# Patient Record
Sex: Male | Born: 1935 | Hispanic: No | Marital: Married | Smoking: Former smoker
Health system: Southern US, Community
[De-identification: ages and names within clinical notes are randomized; demographics above are authoritative.]

## PROBLEM LIST (undated history)

## (undated) DIAGNOSIS — E559 Vitamin D deficiency, unspecified: Secondary | ICD-10-CM

## (undated) DIAGNOSIS — F329 Major depressive disorder, single episode, unspecified: Secondary | ICD-10-CM

## (undated) DIAGNOSIS — R062 Wheezing: Secondary | ICD-10-CM

## (undated) DIAGNOSIS — I5022 Chronic systolic (congestive) heart failure: Secondary | ICD-10-CM

## (undated) DIAGNOSIS — R531 Weakness: Secondary | ICD-10-CM

## (undated) DIAGNOSIS — I4891 Unspecified atrial fibrillation: Secondary | ICD-10-CM

## (undated) DIAGNOSIS — D509 Iron deficiency anemia, unspecified: Secondary | ICD-10-CM

## (undated) DIAGNOSIS — F32A Depression, unspecified: Secondary | ICD-10-CM

## (undated) DIAGNOSIS — K219 Gastro-esophageal reflux disease without esophagitis: Secondary | ICD-10-CM

## (undated) DIAGNOSIS — N400 Enlarged prostate without lower urinary tract symptoms: Secondary | ICD-10-CM

## (undated) DIAGNOSIS — J189 Pneumonia, unspecified organism: Secondary | ICD-10-CM

## (undated) DIAGNOSIS — R269 Unspecified abnormalities of gait and mobility: Secondary | ICD-10-CM

## (undated) DIAGNOSIS — E039 Hypothyroidism, unspecified: Secondary | ICD-10-CM

## (undated) DIAGNOSIS — G309 Alzheimer's disease, unspecified: Secondary | ICD-10-CM

## (undated) DIAGNOSIS — I456 Pre-excitation syndrome: Secondary | ICD-10-CM

## (undated) DIAGNOSIS — I2510 Atherosclerotic heart disease of native coronary artery without angina pectoris: Secondary | ICD-10-CM

## (undated) DIAGNOSIS — E119 Type 2 diabetes mellitus without complications: Secondary | ICD-10-CM

## (undated) DIAGNOSIS — F0390 Unspecified dementia without behavioral disturbance: Secondary | ICD-10-CM

## (undated) DIAGNOSIS — F0280 Dementia in other diseases classified elsewhere without behavioral disturbance: Secondary | ICD-10-CM

## (undated) DIAGNOSIS — I10 Essential (primary) hypertension: Secondary | ICD-10-CM

## (undated) DIAGNOSIS — I739 Peripheral vascular disease, unspecified: Secondary | ICD-10-CM

## (undated) DIAGNOSIS — N189 Chronic kidney disease, unspecified: Secondary | ICD-10-CM

## (undated) DIAGNOSIS — E785 Hyperlipidemia, unspecified: Secondary | ICD-10-CM

## (undated) HISTORY — PX: IMPLANTABLE CARDIOVERTER DEFIBRILLATOR IMPLANT: SHX5860

## (undated) HISTORY — PX: PACEMAKER PLACEMENT: SHX43

## (undated) HISTORY — DX: Benign prostatic hyperplasia without lower urinary tract symptoms: N40.0

## (undated) HISTORY — DX: Iron deficiency anemia, unspecified: D50.9

## (undated) HISTORY — PX: CORONARY ARTERY BYPASS GRAFT: SHX141

## (undated) HISTORY — DX: Wheezing: R06.2

## (undated) HISTORY — DX: Vitamin D deficiency, unspecified: E55.9

## (undated) HISTORY — DX: Weakness: R53.1

## (undated) HISTORY — DX: Unspecified abnormalities of gait and mobility: R26.9

## (undated) HISTORY — PX: ABDOMINAL AORTIC ANEURYSM REPAIR: SUR1152

## (undated) HISTORY — DX: Chronic kidney disease, unspecified: N18.9

## (undated) HISTORY — DX: Pneumonia, unspecified organism: J18.9

## (undated) HISTORY — DX: Gastro-esophageal reflux disease without esophagitis: K21.9

---

## 2003-08-02 ENCOUNTER — Other Ambulatory Visit: Payer: Self-pay

## 2005-11-28 ENCOUNTER — Ambulatory Visit: Payer: Self-pay | Admitting: Internal Medicine

## 2005-12-16 ENCOUNTER — Ambulatory Visit: Payer: Self-pay | Admitting: Internal Medicine

## 2006-01-28 ENCOUNTER — Ambulatory Visit: Payer: Self-pay | Admitting: Internal Medicine

## 2006-02-11 ENCOUNTER — Ambulatory Visit: Payer: Self-pay | Admitting: Internal Medicine

## 2006-02-16 ENCOUNTER — Ambulatory Visit: Payer: Self-pay | Admitting: Internal Medicine

## 2006-03-18 ENCOUNTER — Ambulatory Visit: Payer: Self-pay | Admitting: Internal Medicine

## 2006-04-18 ENCOUNTER — Ambulatory Visit: Payer: Self-pay | Admitting: Internal Medicine

## 2006-08-11 ENCOUNTER — Emergency Department: Payer: Self-pay | Admitting: Internal Medicine

## 2007-05-06 ENCOUNTER — Inpatient Hospital Stay: Payer: Self-pay | Admitting: Internal Medicine

## 2007-05-06 ENCOUNTER — Other Ambulatory Visit: Payer: Self-pay

## 2007-05-19 ENCOUNTER — Ambulatory Visit: Payer: Self-pay | Admitting: Internal Medicine

## 2007-05-22 ENCOUNTER — Ambulatory Visit: Payer: Self-pay | Admitting: Family

## 2007-06-19 ENCOUNTER — Ambulatory Visit: Payer: Self-pay | Admitting: Internal Medicine

## 2007-07-19 ENCOUNTER — Ambulatory Visit: Payer: Self-pay | Admitting: Internal Medicine

## 2007-08-15 ENCOUNTER — Observation Stay: Payer: Self-pay | Admitting: Internal Medicine

## 2007-08-17 ENCOUNTER — Ambulatory Visit: Payer: Self-pay | Admitting: Internal Medicine

## 2007-08-21 ENCOUNTER — Ambulatory Visit: Payer: Self-pay | Admitting: Internal Medicine

## 2007-09-11 ENCOUNTER — Ambulatory Visit: Payer: Self-pay | Admitting: Unknown Physician Specialty

## 2007-09-14 ENCOUNTER — Inpatient Hospital Stay: Payer: Self-pay | Admitting: Internal Medicine

## 2007-09-14 ENCOUNTER — Other Ambulatory Visit: Payer: Self-pay

## 2007-09-17 ENCOUNTER — Ambulatory Visit: Payer: Self-pay | Admitting: Internal Medicine

## 2007-09-21 ENCOUNTER — Ambulatory Visit: Payer: Self-pay | Admitting: Internal Medicine

## 2007-10-09 ENCOUNTER — Other Ambulatory Visit: Payer: Self-pay

## 2007-10-09 ENCOUNTER — Inpatient Hospital Stay: Payer: Self-pay | Admitting: Internal Medicine

## 2007-10-17 ENCOUNTER — Ambulatory Visit: Payer: Self-pay | Admitting: Internal Medicine

## 2007-11-17 ENCOUNTER — Ambulatory Visit: Payer: Self-pay | Admitting: Internal Medicine

## 2007-12-17 ENCOUNTER — Ambulatory Visit: Payer: Self-pay | Admitting: Internal Medicine

## 2008-01-14 ENCOUNTER — Emergency Department: Payer: Self-pay | Admitting: Unknown Physician Specialty

## 2008-01-29 ENCOUNTER — Emergency Department: Payer: Self-pay | Admitting: Emergency Medicine

## 2008-01-30 ENCOUNTER — Other Ambulatory Visit: Payer: Self-pay

## 2008-01-30 ENCOUNTER — Emergency Department: Payer: Self-pay | Admitting: Emergency Medicine

## 2008-02-17 ENCOUNTER — Ambulatory Visit: Payer: Self-pay | Admitting: Internal Medicine

## 2008-03-12 ENCOUNTER — Ambulatory Visit: Payer: Self-pay | Admitting: Internal Medicine

## 2008-03-17 ENCOUNTER — Inpatient Hospital Stay: Payer: Self-pay | Admitting: Internal Medicine

## 2008-03-19 ENCOUNTER — Ambulatory Visit: Payer: Self-pay | Admitting: Internal Medicine

## 2008-04-18 ENCOUNTER — Ambulatory Visit: Payer: Self-pay | Admitting: Internal Medicine

## 2008-05-18 ENCOUNTER — Ambulatory Visit: Payer: Self-pay | Admitting: Internal Medicine

## 2008-06-18 ENCOUNTER — Ambulatory Visit: Payer: Self-pay | Admitting: Internal Medicine

## 2008-07-19 ENCOUNTER — Ambulatory Visit: Payer: Self-pay | Admitting: Internal Medicine

## 2008-07-26 ENCOUNTER — Emergency Department: Payer: Self-pay | Admitting: Emergency Medicine

## 2008-08-05 ENCOUNTER — Inpatient Hospital Stay: Payer: Self-pay | Admitting: Internal Medicine

## 2008-08-16 ENCOUNTER — Ambulatory Visit: Payer: Self-pay | Admitting: Internal Medicine

## 2008-08-17 ENCOUNTER — Inpatient Hospital Stay: Payer: Self-pay | Admitting: Internal Medicine

## 2008-08-25 ENCOUNTER — Ambulatory Visit: Payer: Self-pay | Admitting: Internal Medicine

## 2009-04-18 ENCOUNTER — Ambulatory Visit: Payer: Self-pay | Admitting: Internal Medicine

## 2009-05-16 ENCOUNTER — Ambulatory Visit: Payer: Self-pay | Admitting: Internal Medicine

## 2009-05-18 ENCOUNTER — Ambulatory Visit: Payer: Self-pay | Admitting: Internal Medicine

## 2009-06-18 ENCOUNTER — Ambulatory Visit: Payer: Self-pay | Admitting: Internal Medicine

## 2009-07-19 ENCOUNTER — Ambulatory Visit: Payer: Self-pay | Admitting: Internal Medicine

## 2009-07-27 ENCOUNTER — Ambulatory Visit: Payer: Self-pay | Admitting: Gastroenterology

## 2009-08-16 ENCOUNTER — Ambulatory Visit: Payer: Self-pay | Admitting: Internal Medicine

## 2009-09-10 IMAGING — CR DG CHEST 2V
1 series · 2 of 2 positions shown · non-contrast
Comparison: none

REASON FOR EXAM: f/u of findings of pulmonary vascular congestion poss.
some mild interstitial ed
COMMENTS:   LMP: (Male)

[Series 1: view not recorded · 0.17mm/px · 2 of 2 slices shown]
[im 1/2]
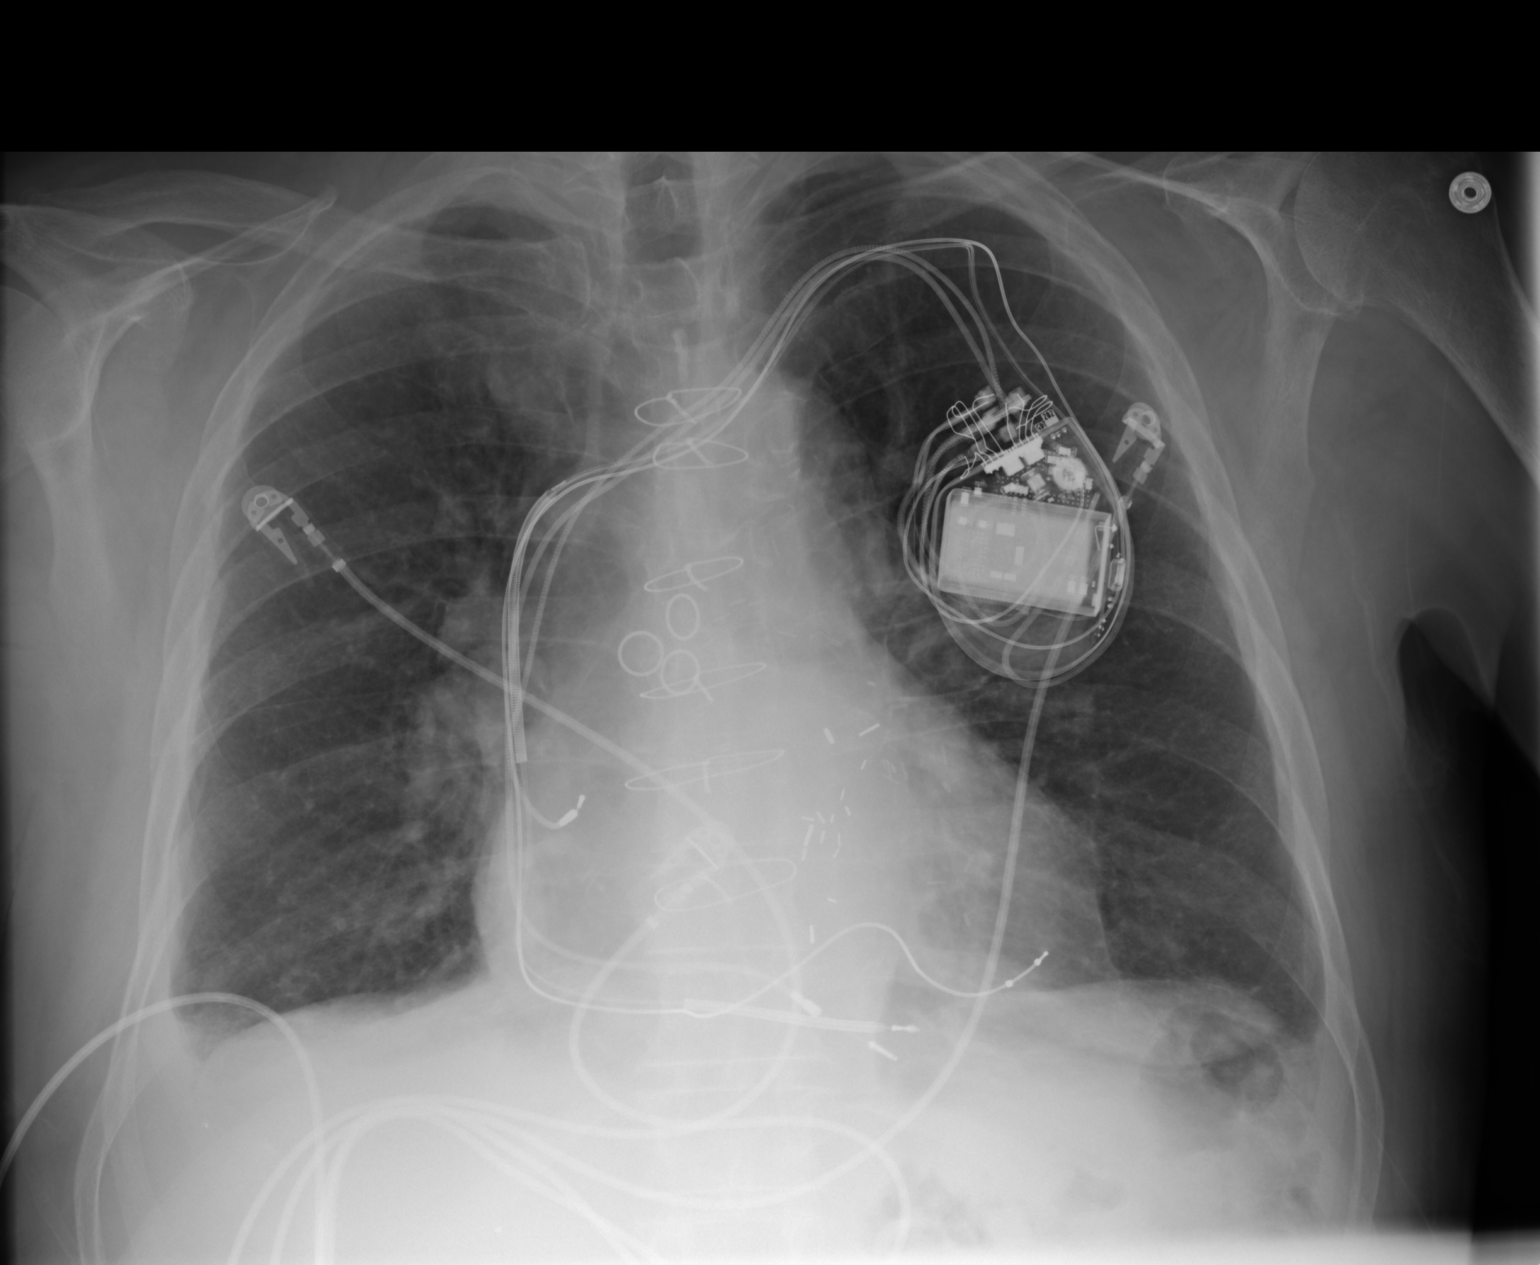
[im 2/2]
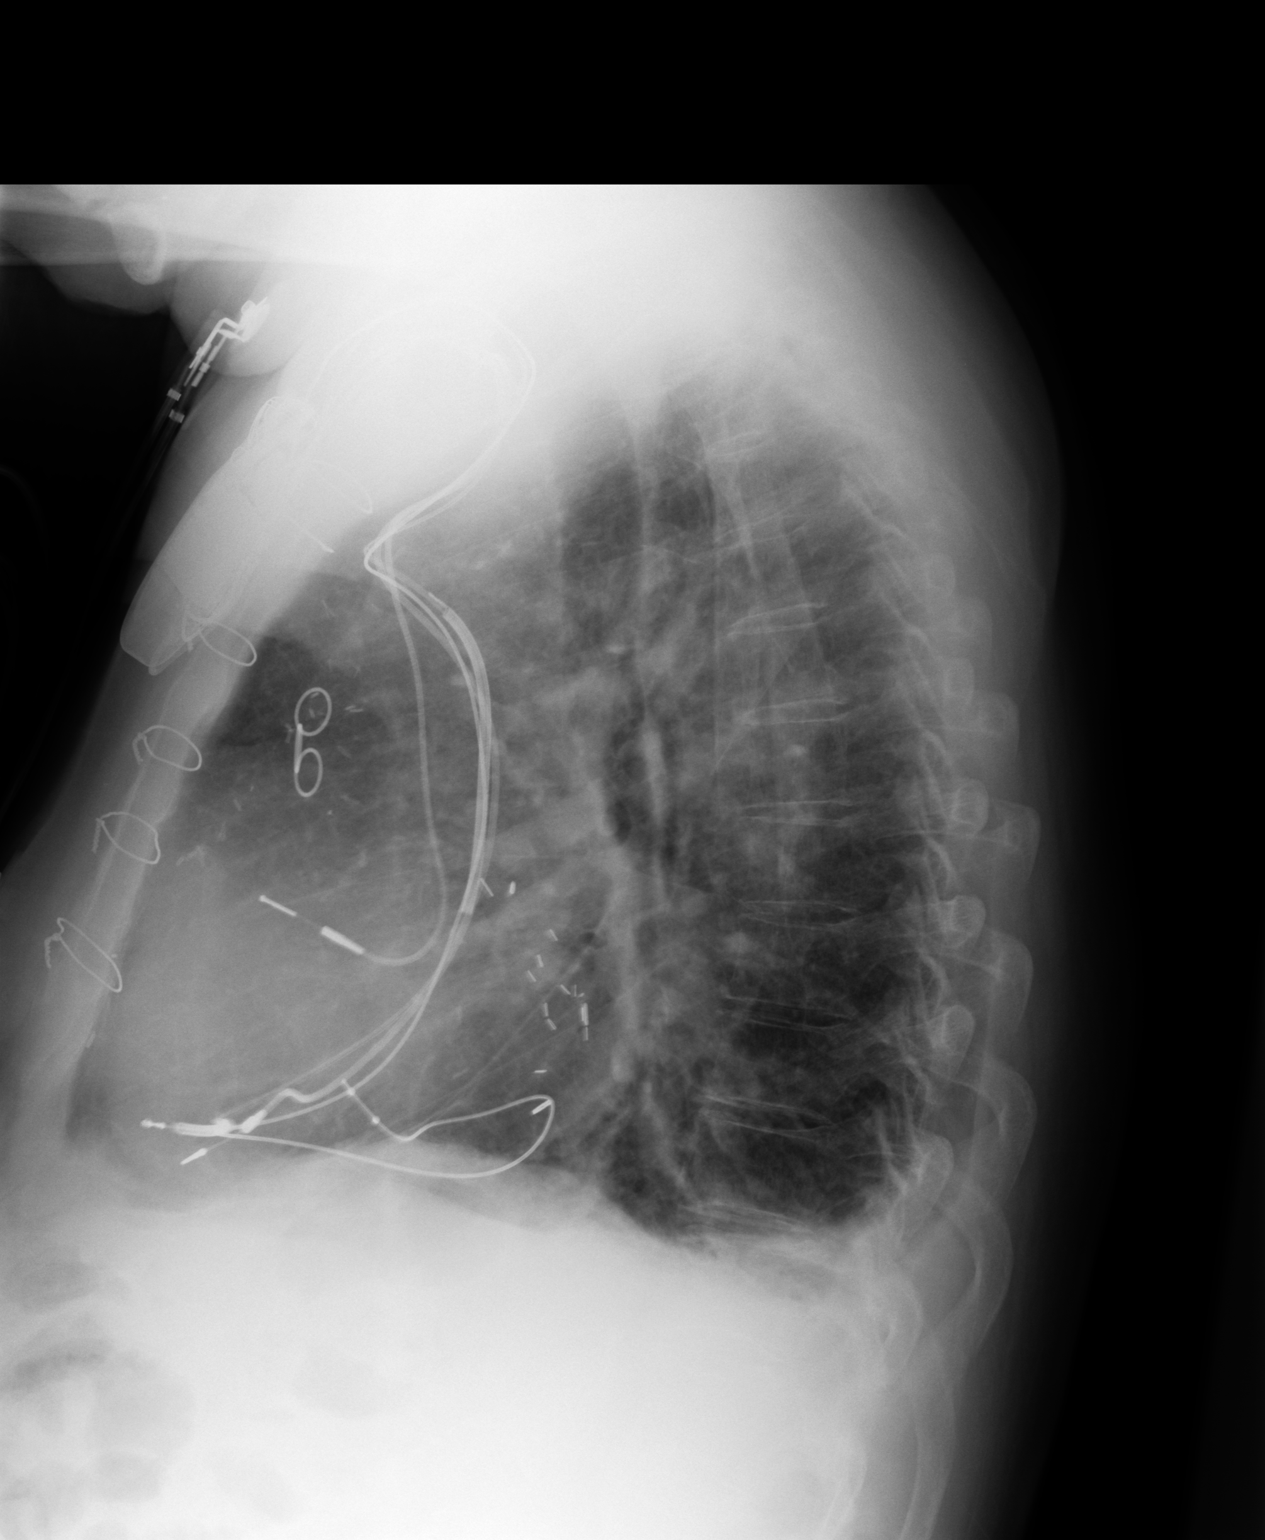

[2 of 2 positions shown; findings below may reference images not displayed]

PROCEDURE:     DXR - DXR CHEST PA (OR AP) AND LATERAL  - September 17, 2007  [DATE]

RESULT:     Comparison is made to a study 14 September, 2007.

The lungs are adequately inflated. Small amounts of pleural fluid are
present at the lung bases blunting the costophrenic angles. The cardiac
silhouette is top normal in size. There is a permanent pacemaker in place.
There is mild prominence of the central pulmonary vascularity. The patient
has undergone prior CABG.
IMPRESSION: 1.There are findings which likely reflect an element of low-grade CHF with
small bilateral pleural effusions. There is no evidence of focal pneumonia.

## 2009-09-16 ENCOUNTER — Ambulatory Visit: Payer: Self-pay | Admitting: Internal Medicine

## 2009-10-16 ENCOUNTER — Ambulatory Visit: Payer: Self-pay | Admitting: Internal Medicine

## 2009-11-16 ENCOUNTER — Ambulatory Visit: Payer: Self-pay | Admitting: Internal Medicine

## 2009-12-16 ENCOUNTER — Ambulatory Visit: Payer: Self-pay | Admitting: Internal Medicine

## 2010-01-07 IMAGING — CT CT CERVICAL SPINE WITHOUT CONTRAST
2 series · 16 of 27 positions shown, 20 images · non-contrast
Comparison: none

REASON FOR EXAM: Fall, neck pain
COMMENTS:

PROCEDURE:     CT  - CT CERVICAL SPINE WO  - January 15, 2008 [DATE]
RESULT:     Noncontrast, emergent CT of the cervical spine is reconstructed
at bone window settings in the axial, sagittal and coronal planes.
Comparison is made to previous examination from 08/11/2006.

[Series 2: axials · axial · 0.33mm/px · z∈[-204,-39]mm · 11 of 66 slices shown, 14 images]
[im 6/66  soft-tissue]
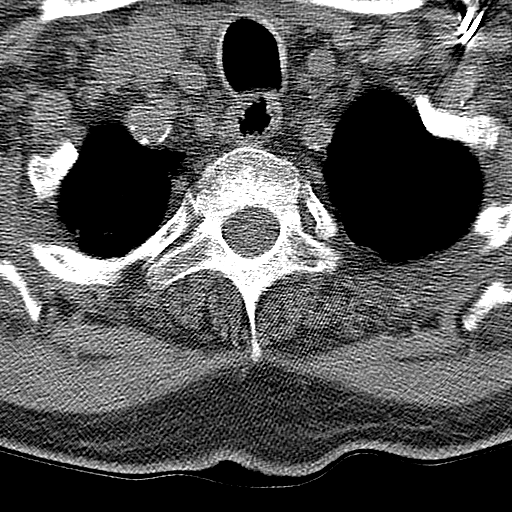
[im 6/66  bone]
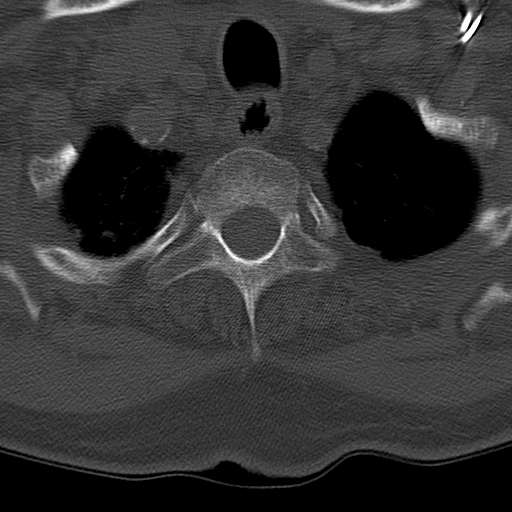
[im 11/66  bone]
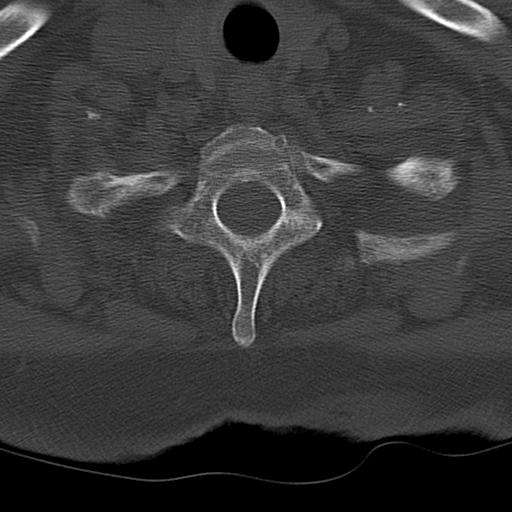
[im 16/66  bone]
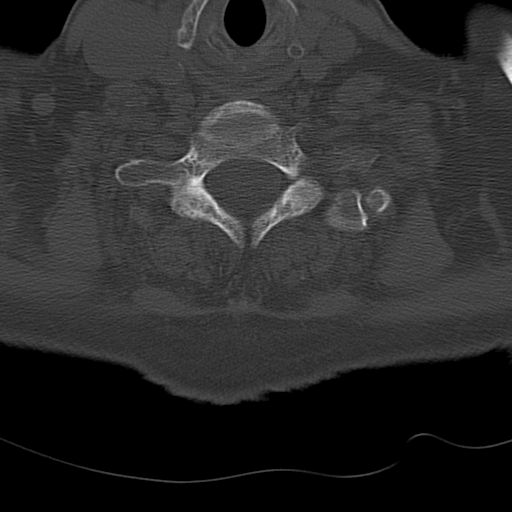
[im 21/66  bone]
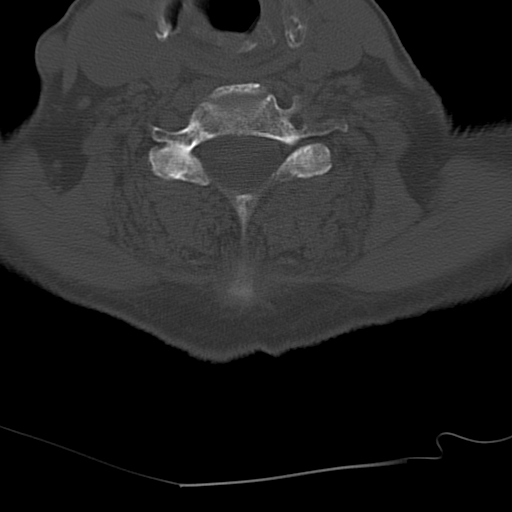
[im 26/66  soft-tissue]
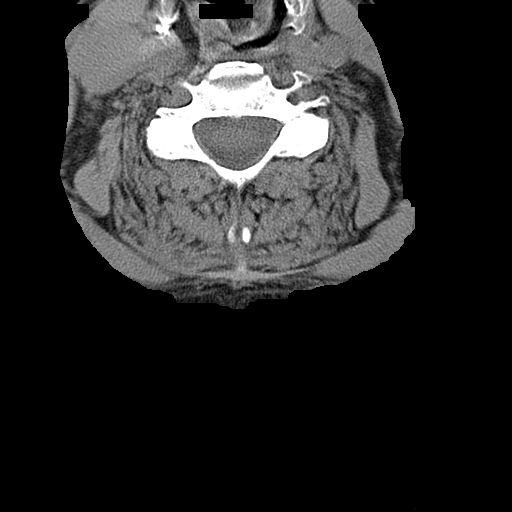
[im 26/66  bone]
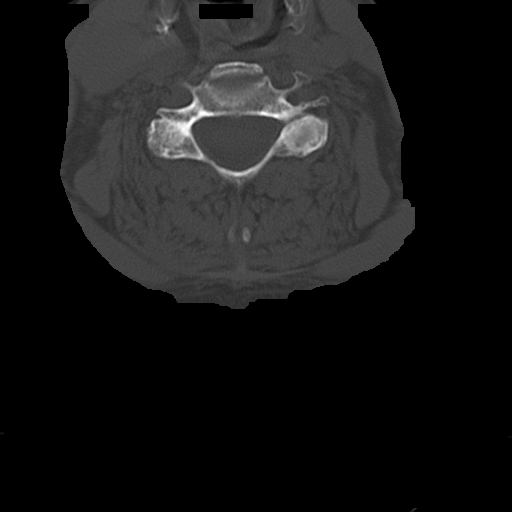
[im 36/66  bone]
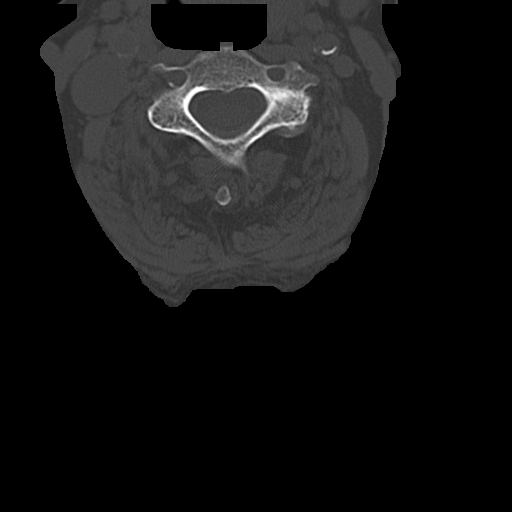
[im 41/66  bone]
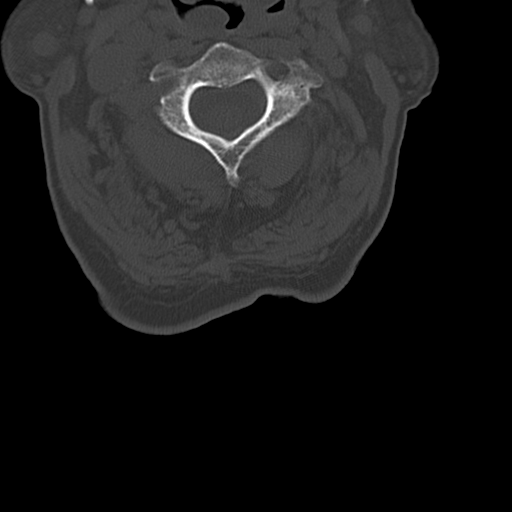
[im 46/66  bone]
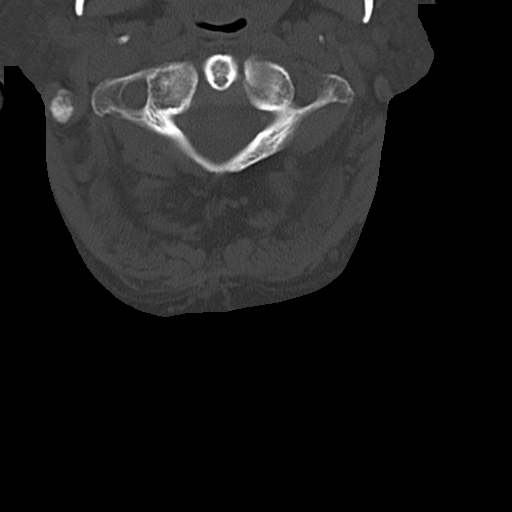
[im 51/66  soft-tissue]
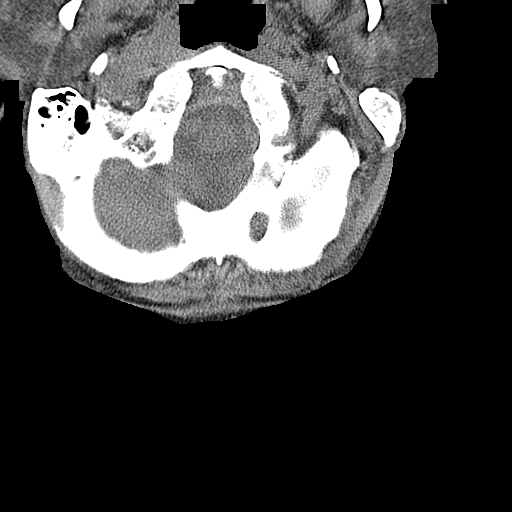
[im 51/66  bone]
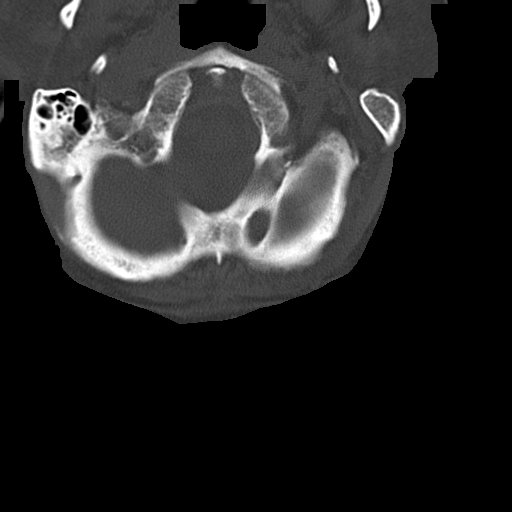
[im 56/66  bone]
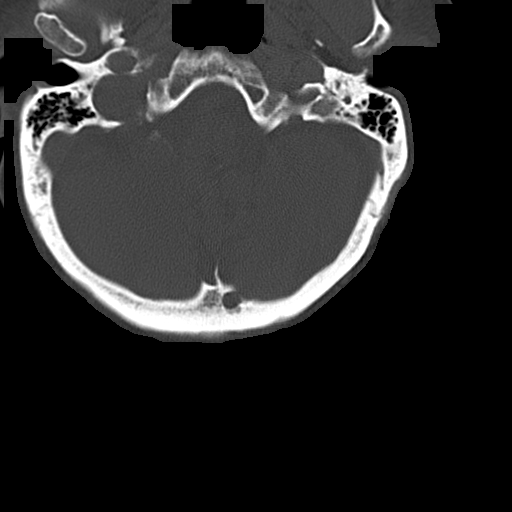
[im 61/66  bone]
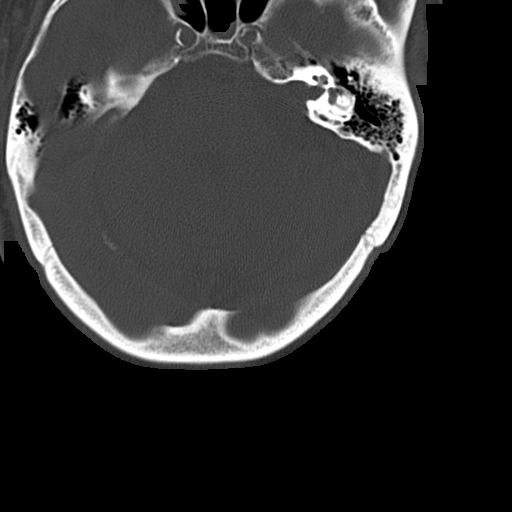

[Series 3: sagittals · sagittal · 0.45mm/px · 5 of 29 slices shown, 6 images]
[im 10/29  bone]
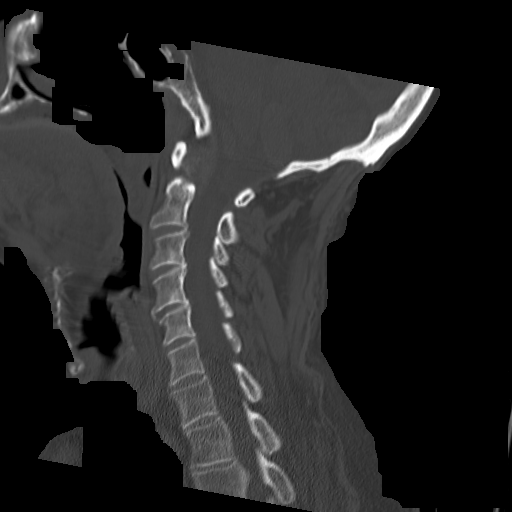
[im 12/29  bone]
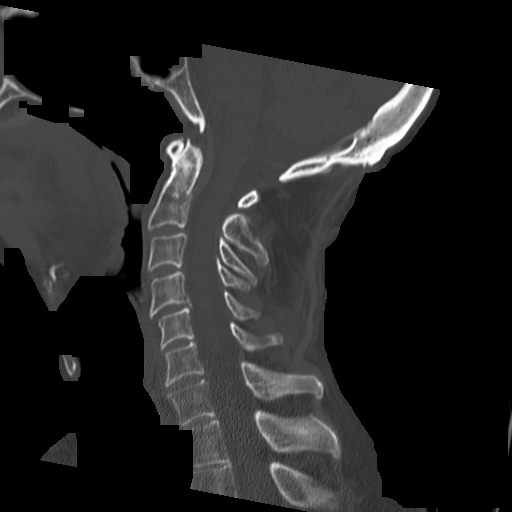
[im 15/29  soft-tissue]
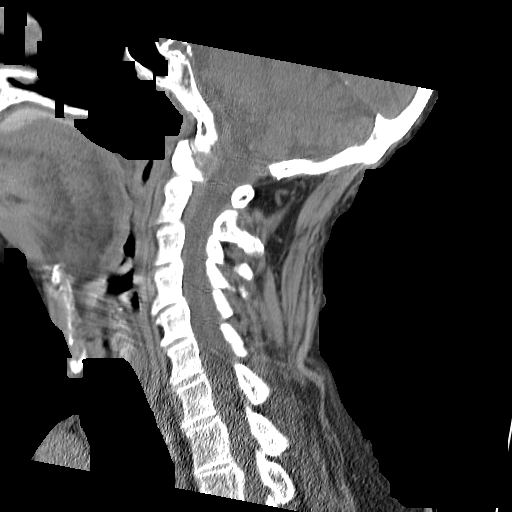
[im 15/29  bone]
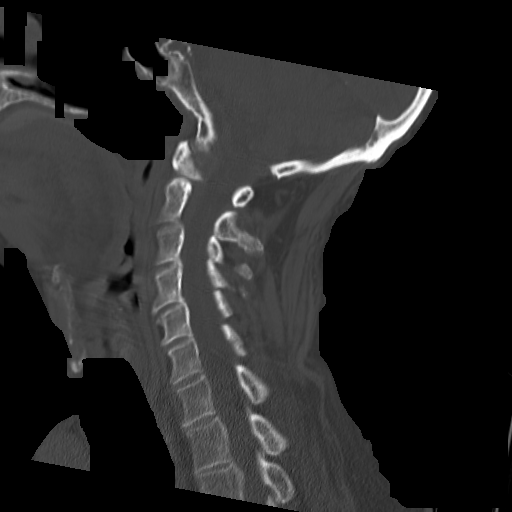
[im 17/29  bone]
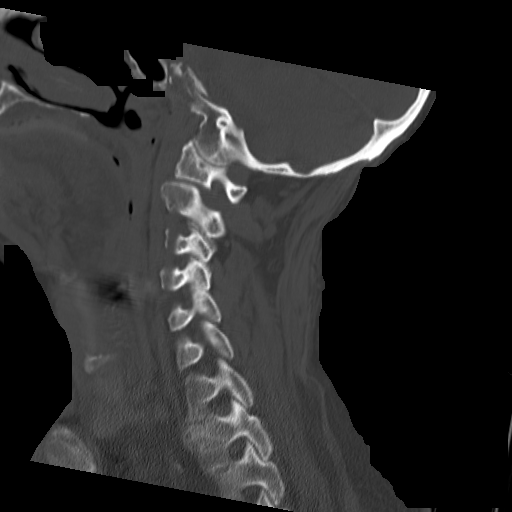
[im 19/29  bone]
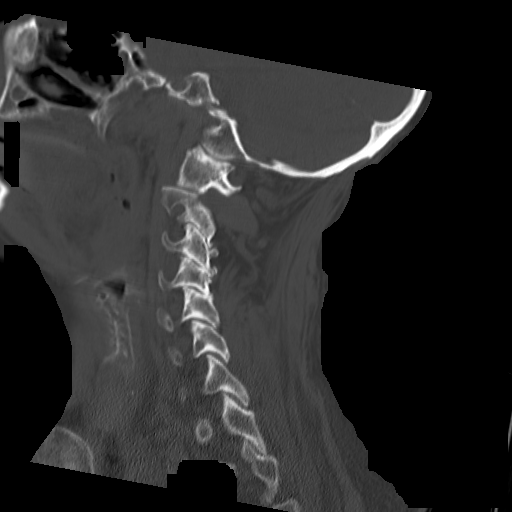

[16 of 27 positions shown; findings below may reference images not displayed]

FINDINGS: The prevertebral soft tissues are normal. The spinal alignment is
maintained. No fracture is evident. The craniocervical junction and C1-2
articulation appear to be maintained. There is some mild multilevel facet
hypertrophy.
IMPRESSION: No acute cervical spine bony abnormality. Mild degenerative
changes are present. There does not appear to be significant interval change.

## 2010-01-16 ENCOUNTER — Ambulatory Visit: Payer: Self-pay | Admitting: Internal Medicine

## 2010-02-16 ENCOUNTER — Ambulatory Visit: Payer: Self-pay | Admitting: Internal Medicine

## 2010-04-20 ENCOUNTER — Ambulatory Visit: Payer: Self-pay | Admitting: Internal Medicine

## 2010-05-18 ENCOUNTER — Ambulatory Visit: Payer: Self-pay | Admitting: Internal Medicine

## 2010-06-18 ENCOUNTER — Ambulatory Visit: Payer: Self-pay | Admitting: Internal Medicine

## 2010-07-19 ENCOUNTER — Ambulatory Visit: Payer: Self-pay | Admitting: Internal Medicine

## 2010-07-30 IMAGING — CR DG CHEST 2V
1 series · 2 of 2 positions shown · non-contrast
Comparison: none

REASON FOR EXAM: pneumonia
COMMENTS:

[Series 1: view not recorded · 0.17mm/px · 2 of 2 slices shown]
[im 1/2]
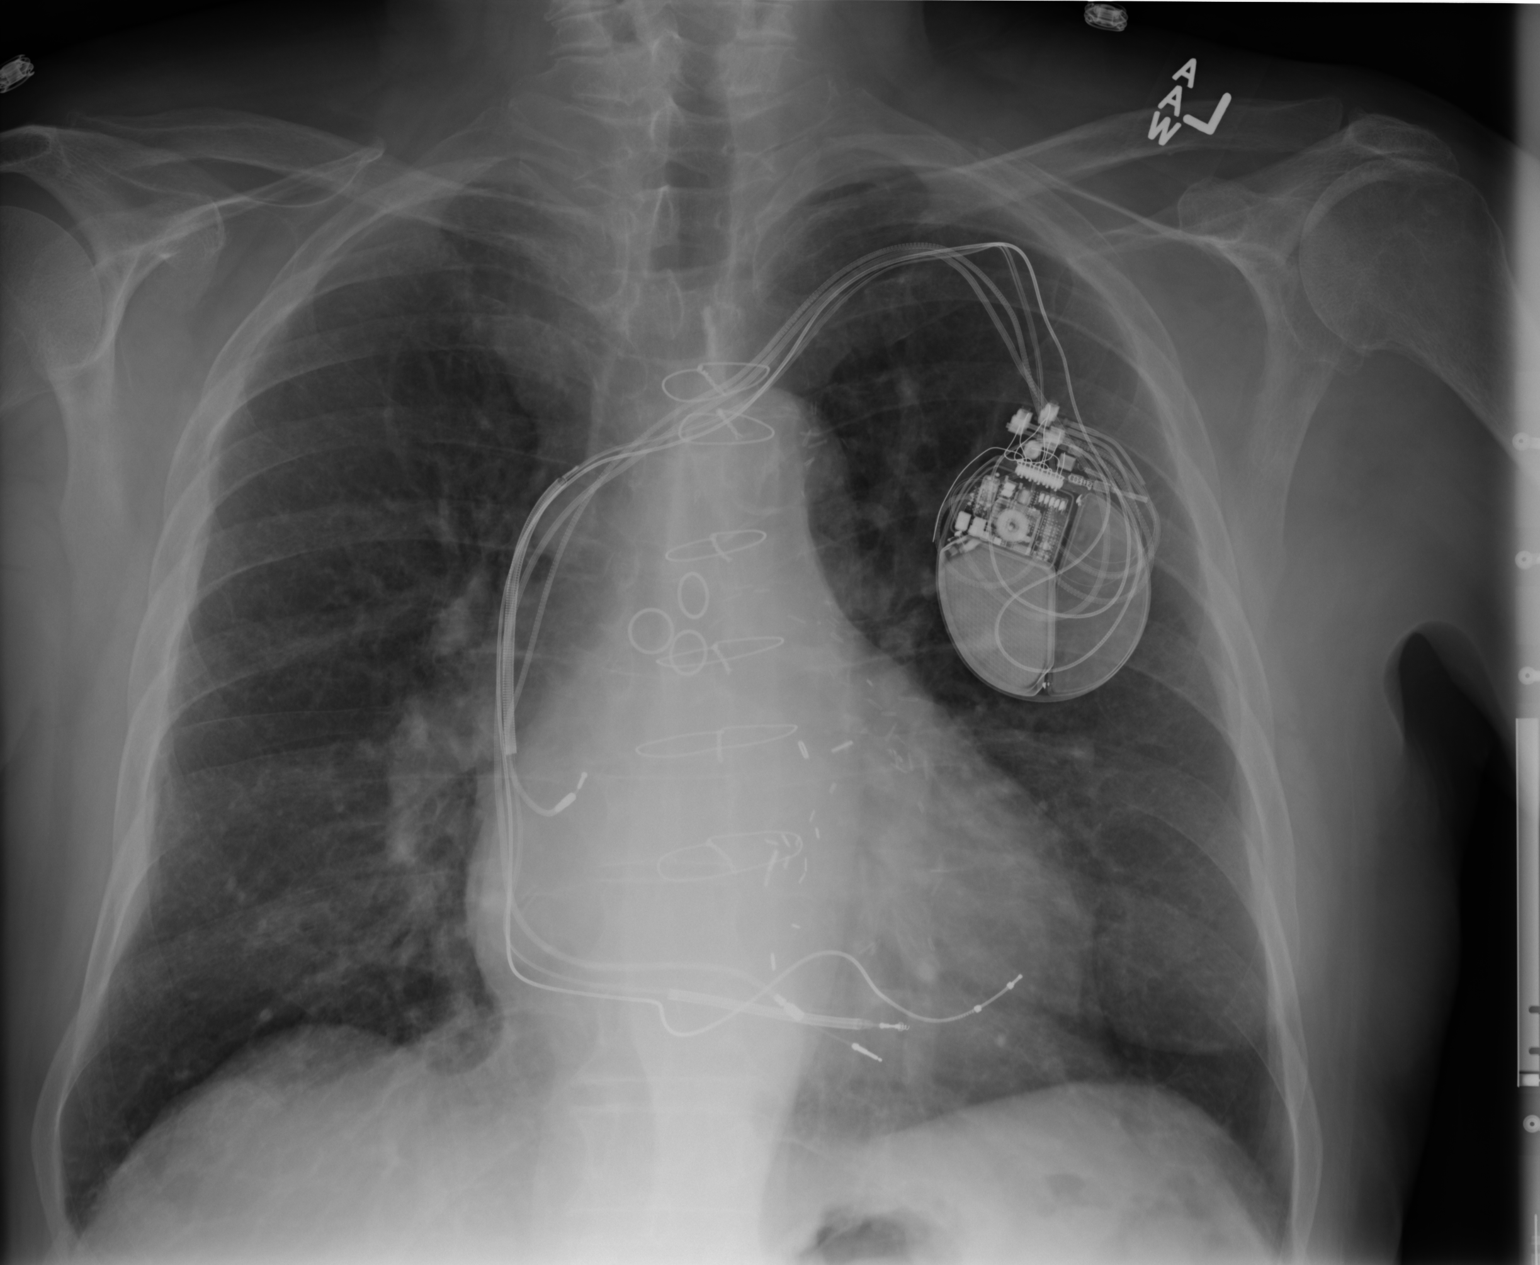
[im 2/2]
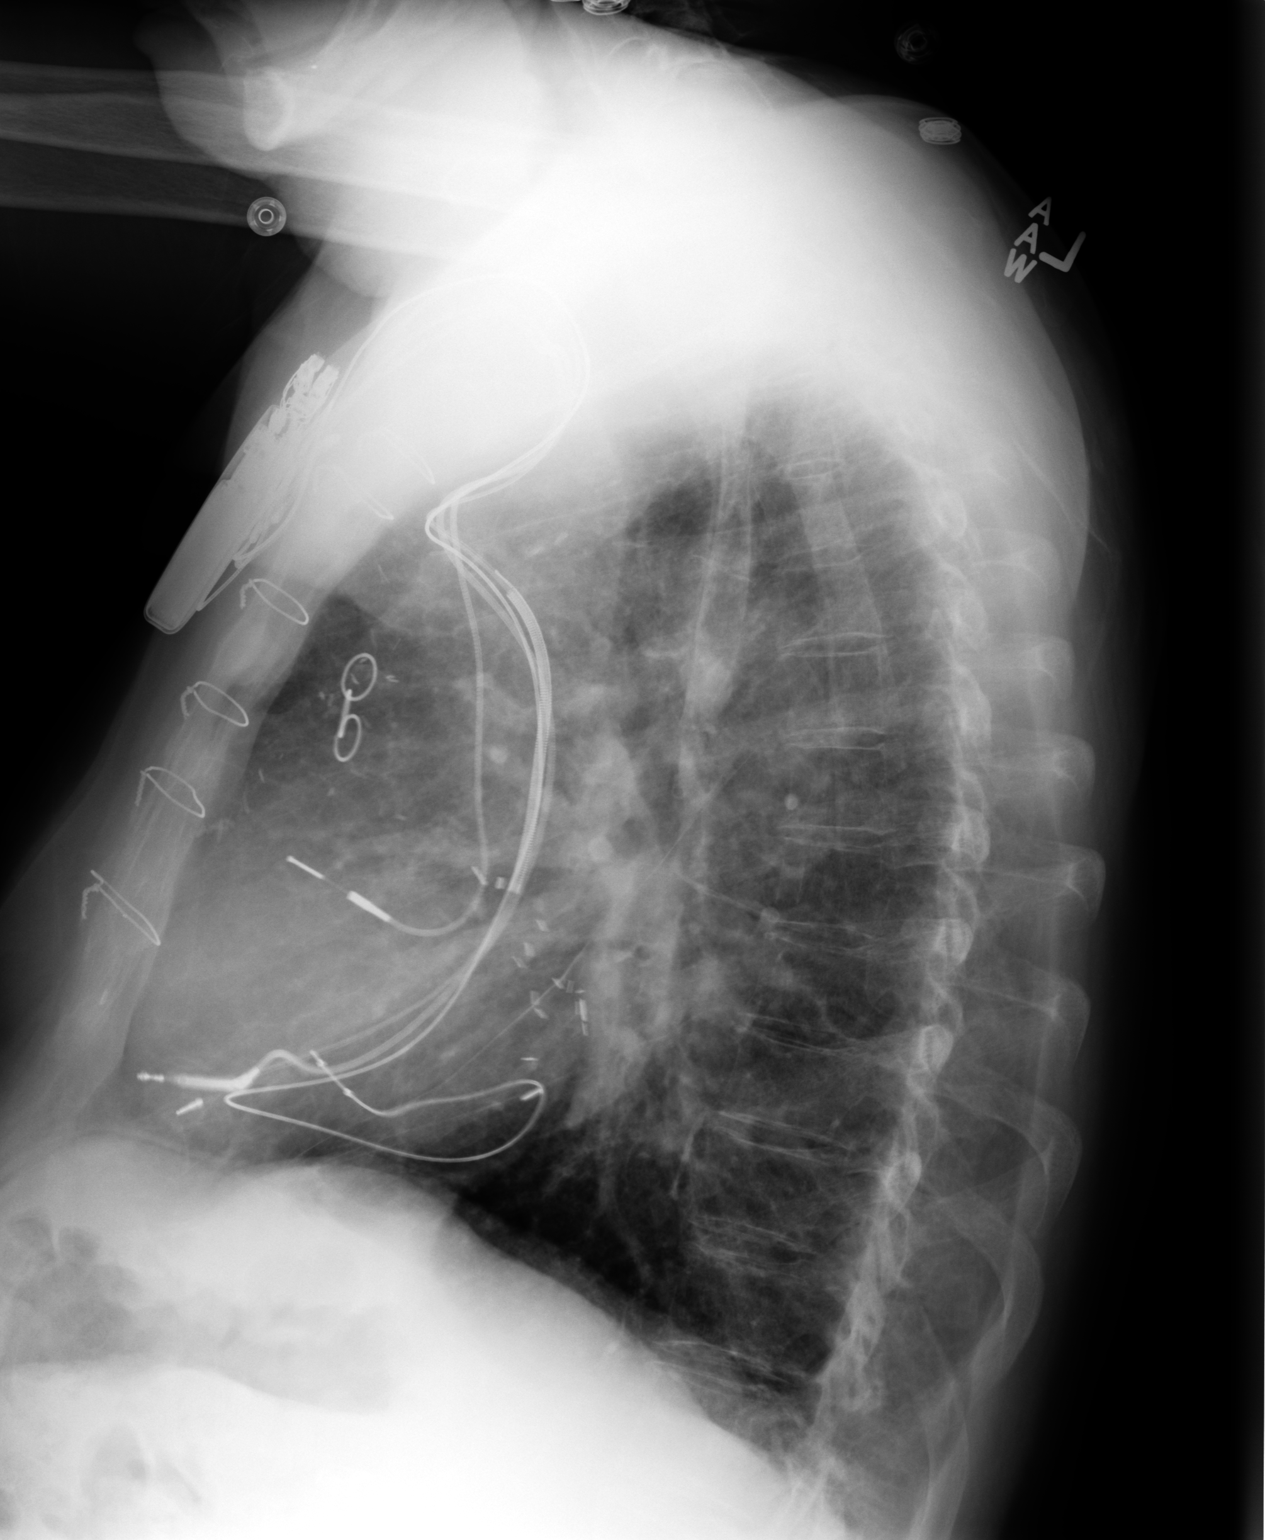

[2 of 2 positions shown; findings below may reference images not displayed]

PROCEDURE:     DXR - DXR CHEST PA (OR AP) AND LATERAL  - August 05, 2008 [DATE]

RESULT:     Comparison is made to study 30 January, 2008.

The lungs are adequately inflated with mild hemidiaphragm flattening. There
is no focal infiltrate. The cardiac silhouette is top normal in size. The
pulmonary vascularity is prominent centrally. The pulmonary interstitial
markings are less conspicuous today. There is a permanent pacemaker in
place. The patient has undergone prior CABG.
IMPRESSION: There are findings consistent with COPD. There may be
superimposed low-grade CHF. I cannot exclude acute bronchitis but no focal
pneumonia is identified.

## 2010-08-17 ENCOUNTER — Ambulatory Visit: Payer: Self-pay | Admitting: Unknown Physician Specialty

## 2010-08-28 ENCOUNTER — Ambulatory Visit: Payer: Self-pay | Admitting: Internal Medicine

## 2010-09-17 ENCOUNTER — Ambulatory Visit: Payer: Self-pay | Admitting: Internal Medicine

## 2010-10-31 ENCOUNTER — Ambulatory Visit: Payer: Self-pay | Admitting: Internal Medicine

## 2010-11-15 ENCOUNTER — Ambulatory Visit: Payer: Self-pay | Admitting: Vascular Surgery

## 2010-11-17 ENCOUNTER — Ambulatory Visit: Payer: Self-pay | Admitting: Internal Medicine

## 2010-11-23 ENCOUNTER — Ambulatory Visit: Payer: Self-pay | Admitting: Vascular Surgery

## 2010-11-30 ENCOUNTER — Inpatient Hospital Stay: Payer: Self-pay | Admitting: Vascular Surgery

## 2010-12-27 ENCOUNTER — Ambulatory Visit: Payer: Self-pay | Admitting: Internal Medicine

## 2011-01-17 ENCOUNTER — Ambulatory Visit: Payer: Self-pay | Admitting: Internal Medicine

## 2011-02-28 ENCOUNTER — Ambulatory Visit: Payer: Self-pay | Admitting: Internal Medicine

## 2011-03-21 ENCOUNTER — Ambulatory Visit: Payer: Self-pay | Admitting: Surgery

## 2011-03-22 ENCOUNTER — Ambulatory Visit: Payer: Self-pay | Admitting: Internal Medicine

## 2011-04-02 ENCOUNTER — Ambulatory Visit: Payer: Self-pay | Admitting: Surgery

## 2011-04-25 ENCOUNTER — Ambulatory Visit: Payer: Self-pay | Admitting: Internal Medicine

## 2011-05-19 ENCOUNTER — Ambulatory Visit: Payer: Self-pay | Admitting: Internal Medicine

## 2011-06-20 ENCOUNTER — Ambulatory Visit: Payer: Self-pay | Admitting: Internal Medicine

## 2011-06-20 LAB — CBC CANCER CENTER
Basophil #: 0 x10 3/mm (ref 0.0–0.1)
HCT: 33.8 % — ABNORMAL LOW (ref 40.0–52.0)
HGB: 11.4 g/dL — ABNORMAL LOW (ref 13.0–18.0)
Lymphocyte #: 0.6 x10 3/mm — ABNORMAL LOW (ref 1.0–3.6)
Lymphocyte %: 14.9 %
MCH: 29.1 pg (ref 26.0–34.0)
MCV: 86 fL (ref 80–100)
Monocyte %: 9 %
Platelet: 147 x10 3/mm — ABNORMAL LOW (ref 150–440)
RDW: 15.9 % — ABNORMAL HIGH (ref 11.5–14.5)

## 2011-06-22 ENCOUNTER — Emergency Department: Payer: Self-pay | Admitting: Unknown Physician Specialty

## 2011-06-22 LAB — COMPREHENSIVE METABOLIC PANEL
Anion Gap: 10 (ref 7–16)
Bilirubin,Total: 0.5 mg/dL (ref 0.2–1.0)
Calcium, Total: 9 mg/dL (ref 8.5–10.1)
Chloride: 99 mmol/L (ref 98–107)
EGFR (African American): 43 — ABNORMAL LOW
Glucose: 95 mg/dL (ref 65–99)
Osmolality: 284 (ref 275–301)
Potassium: 4.3 mmol/L (ref 3.5–5.1)
SGOT(AST): 28 U/L (ref 15–37)
Sodium: 137 mmol/L (ref 136–145)

## 2011-06-22 LAB — CBC
HCT: 34.3 % — ABNORMAL LOW (ref 40.0–52.0)
HGB: 11.3 g/dL — ABNORMAL LOW (ref 13.0–18.0)
MCHC: 33 g/dL (ref 32.0–36.0)
MCV: 87 fL (ref 80–100)
RBC: 3.92 10*6/uL — ABNORMAL LOW (ref 4.40–5.90)
RDW: 15.4 % — ABNORMAL HIGH (ref 11.5–14.5)

## 2011-06-22 LAB — MAGNESIUM: Magnesium: 2.1 mg/dL

## 2011-06-22 LAB — PROTIME-INR: INR: 1.9

## 2011-07-20 ENCOUNTER — Ambulatory Visit: Payer: Self-pay | Admitting: Internal Medicine

## 2011-08-15 LAB — CBC CANCER CENTER
Eosinophil %: 1.9 %
HCT: 32.6 % — ABNORMAL LOW (ref 40.0–52.0)
HGB: 10.8 g/dL — ABNORMAL LOW (ref 13.0–18.0)
Lymphocyte #: 0.9 x10 3/mm — ABNORMAL LOW (ref 1.0–3.6)
MCHC: 33.1 g/dL (ref 32.0–36.0)
MCV: 85 fL (ref 80–100)
Monocyte %: 10.5 %
Neutrophil #: 2.5 x10 3/mm (ref 1.4–6.5)
Neutrophil %: 63 %
RBC: 3.84 10*6/uL — ABNORMAL LOW (ref 4.40–5.90)
WBC: 4 x10 3/mm (ref 3.8–10.6)

## 2011-08-15 LAB — FERRITIN: Ferritin (ARMC): 21 ng/mL (ref 8–388)

## 2011-08-17 ENCOUNTER — Ambulatory Visit: Payer: Self-pay | Admitting: Internal Medicine

## 2011-10-15 ENCOUNTER — Ambulatory Visit: Payer: Self-pay | Admitting: Internal Medicine

## 2011-10-15 LAB — CBC CANCER CENTER
Basophil #: 0 x10 3/mm (ref 0.0–0.1)
Basophil %: 1 %
Eosinophil %: 3.4 %
MCH: 26 pg (ref 26.0–34.0)
MCV: 83 fL (ref 80–100)
Monocyte %: 12.6 %
Neutrophil %: 60.5 %
Platelet: 140 x10 3/mm — ABNORMAL LOW (ref 150–440)
RBC: 4.07 10*6/uL — ABNORMAL LOW (ref 4.40–5.90)

## 2011-10-17 ENCOUNTER — Ambulatory Visit: Payer: Self-pay | Admitting: Internal Medicine

## 2011-12-26 ENCOUNTER — Ambulatory Visit: Payer: Self-pay | Admitting: Internal Medicine

## 2011-12-26 LAB — CBC CANCER CENTER
Basophil #: 0 x10 3/mm (ref 0.0–0.1)
Basophil %: 1 %
Eosinophil #: 0.1 x10 3/mm (ref 0.0–0.7)
HCT: 36 % — ABNORMAL LOW (ref 40.0–52.0)
HGB: 11.5 g/dL — ABNORMAL LOW (ref 13.0–18.0)
Lymphocyte #: 1 x10 3/mm (ref 1.0–3.6)
Lymphocyte %: 27 %
MCH: 26.6 pg (ref 26.0–34.0)
MCHC: 32.1 g/dL (ref 32.0–36.0)
Monocyte #: 0.5 x10 3/mm (ref 0.2–1.0)
Neutrophil #: 2.2 x10 3/mm (ref 1.4–6.5)
Neutrophil %: 57.4 %
RDW: 17.4 % — ABNORMAL HIGH (ref 11.5–14.5)

## 2011-12-26 LAB — FERRITIN: Ferritin (ARMC): 26 ng/mL (ref 8–388)

## 2012-01-17 ENCOUNTER — Ambulatory Visit: Payer: Self-pay | Admitting: Internal Medicine

## 2012-03-26 ENCOUNTER — Ambulatory Visit: Payer: Self-pay | Admitting: Internal Medicine

## 2012-03-26 LAB — CBC CANCER CENTER
Basophil #: 0 x10 3/mm (ref 0.0–0.1)
Basophil %: 0.8 %
Eosinophil #: 0.1 x10 3/mm (ref 0.0–0.7)
HCT: 37.6 % — ABNORMAL LOW (ref 40.0–52.0)
HGB: 13 g/dL (ref 13.0–18.0)
Lymphocyte %: 31.3 %
MCHC: 34.5 g/dL (ref 32.0–36.0)
Monocyte %: 12.3 %
Neutrophil #: 2.9 x10 3/mm (ref 1.4–6.5)
Neutrophil %: 54 %
Platelet: 147 x10 3/mm — ABNORMAL LOW (ref 150–440)
RBC: 4.31 10*6/uL — ABNORMAL LOW (ref 4.40–5.90)
RDW: 16.7 % — ABNORMAL HIGH (ref 11.5–14.5)

## 2012-03-26 LAB — FERRITIN: Ferritin (ARMC): 42 ng/mL (ref 8–388)

## 2012-04-18 ENCOUNTER — Ambulatory Visit: Payer: Self-pay | Admitting: Internal Medicine

## 2012-06-25 ENCOUNTER — Ambulatory Visit: Payer: Self-pay | Admitting: Internal Medicine

## 2012-06-25 LAB — CBC CANCER CENTER
Basophil #: 0 x10 3/mm (ref 0.0–0.1)
Basophil %: 0.7 %
Eosinophil #: 0.1 x10 3/mm (ref 0.0–0.7)
HCT: 36.9 % — ABNORMAL LOW (ref 40.0–52.0)
MCHC: 34.2 g/dL (ref 32.0–36.0)
MCV: 90 fL (ref 80–100)
Monocyte #: 0.5 x10 3/mm (ref 0.2–1.0)
Neutrophil #: 2.5 x10 3/mm (ref 1.4–6.5)
Neutrophil %: 59.6 %
Platelet: 116 x10 3/mm — ABNORMAL LOW (ref 150–440)
RDW: 14.9 % — ABNORMAL HIGH (ref 11.5–14.5)

## 2012-06-25 LAB — FERRITIN: Ferritin (ARMC): 37 ng/mL (ref 8–388)

## 2012-07-19 ENCOUNTER — Ambulatory Visit: Payer: Self-pay | Admitting: Internal Medicine

## 2012-09-23 ENCOUNTER — Ambulatory Visit: Payer: Self-pay | Admitting: Internal Medicine

## 2012-09-24 LAB — CBC CANCER CENTER
Basophil %: 0.7 %
Eosinophil #: 0.1 x10 3/mm (ref 0.0–0.7)
HCT: 38.1 % — ABNORMAL LOW (ref 40.0–52.0)
HGB: 12.7 g/dL — ABNORMAL LOW (ref 13.0–18.0)
Lymphocyte %: 20.8 %
MCH: 29.9 pg (ref 26.0–34.0)
Monocyte #: 0.5 x10 3/mm (ref 0.2–1.0)
Neutrophil #: 2.9 x10 3/mm (ref 1.4–6.5)
Neutrophil %: 65.7 %
Platelet: 109 x10 3/mm — ABNORMAL LOW (ref 150–440)
RBC: 4.23 10*6/uL — ABNORMAL LOW (ref 4.40–5.90)
WBC: 4.4 x10 3/mm (ref 3.8–10.6)

## 2012-09-24 LAB — FERRITIN: Ferritin (ARMC): 43 ng/mL (ref 8–388)

## 2012-10-16 ENCOUNTER — Ambulatory Visit: Payer: Self-pay | Admitting: Internal Medicine

## 2012-12-24 ENCOUNTER — Ambulatory Visit: Payer: Self-pay | Admitting: Internal Medicine

## 2012-12-26 LAB — CBC CANCER CENTER
Basophil %: 0.7 %
Eosinophil #: 0.1 x10 3/mm (ref 0.0–0.7)
Eosinophil %: 1.7 %
HCT: 35 % — ABNORMAL LOW (ref 40.0–52.0)
HGB: 11.5 g/dL — ABNORMAL LOW (ref 13.0–18.0)
Lymphocyte #: 1.3 x10 3/mm (ref 1.0–3.6)
Lymphocyte %: 28.5 %
MCHC: 32.7 g/dL (ref 32.0–36.0)
MCV: 91 fL (ref 80–100)
Monocyte %: 11.4 %
Neutrophil #: 2.7 x10 3/mm (ref 1.4–6.5)
Neutrophil %: 57.7 %
RDW: 14.8 % — ABNORMAL HIGH (ref 11.5–14.5)
WBC: 4.6 x10 3/mm (ref 3.8–10.6)

## 2013-01-16 ENCOUNTER — Ambulatory Visit: Payer: Self-pay | Admitting: Internal Medicine

## 2013-04-03 ENCOUNTER — Ambulatory Visit: Payer: Self-pay | Admitting: Internal Medicine

## 2013-04-03 LAB — CBC CANCER CENTER
Basophil #: 0.1 x10 3/mm (ref 0.0–0.1)
HGB: 12.1 g/dL — ABNORMAL LOW (ref 13.0–18.0)
Lymphocyte #: 1.2 x10 3/mm (ref 1.0–3.6)
Lymphocyte %: 20.1 %
Monocyte #: 0.6 x10 3/mm (ref 0.2–1.0)
Platelet: 174 x10 3/mm (ref 150–440)
WBC: 6 x10 3/mm (ref 3.8–10.6)

## 2013-04-18 ENCOUNTER — Ambulatory Visit: Payer: Self-pay | Admitting: Internal Medicine

## 2013-06-22 ENCOUNTER — Ambulatory Visit: Payer: Self-pay | Admitting: Internal Medicine

## 2013-06-22 LAB — CBC CANCER CENTER
BASOS PCT: 0.7 %
Basophil #: 0 x10 3/mm (ref 0.0–0.1)
EOS PCT: 1.8 %
Eosinophil #: 0.1 x10 3/mm (ref 0.0–0.7)
HCT: 37.7 % — ABNORMAL LOW (ref 40.0–52.0)
HGB: 12.1 g/dL — AB (ref 13.0–18.0)
LYMPHS ABS: 1 x10 3/mm (ref 1.0–3.6)
LYMPHS PCT: 17.9 %
MCH: 28.4 pg (ref 26.0–34.0)
MCHC: 32.3 g/dL (ref 32.0–36.0)
MCV: 88 fL (ref 80–100)
MONO ABS: 0.6 x10 3/mm (ref 0.2–1.0)
Monocyte %: 10.1 %
NEUTROS PCT: 69.5 %
Neutrophil #: 4 x10 3/mm (ref 1.4–6.5)
PLATELETS: 161 x10 3/mm (ref 150–440)
RBC: 4.28 10*6/uL — ABNORMAL LOW (ref 4.40–5.90)
RDW: 16 % — ABNORMAL HIGH (ref 11.5–14.5)
WBC: 5.7 x10 3/mm (ref 3.8–10.6)

## 2013-07-19 ENCOUNTER — Ambulatory Visit: Payer: Self-pay | Admitting: Internal Medicine

## 2013-09-09 ENCOUNTER — Ambulatory Visit: Payer: Self-pay | Admitting: Internal Medicine

## 2013-09-09 LAB — CBC CANCER CENTER
BASOS PCT: 1 %
Basophil #: 0.1 x10 3/mm (ref 0.0–0.1)
Eosinophil #: 0.1 x10 3/mm (ref 0.0–0.7)
Eosinophil %: 2 %
HCT: 38.2 % — ABNORMAL LOW (ref 40.0–52.0)
HGB: 12.2 g/dL — ABNORMAL LOW (ref 13.0–18.0)
LYMPHS ABS: 1.2 x10 3/mm (ref 1.0–3.6)
Lymphocyte %: 21.9 %
MCH: 28.1 pg (ref 26.0–34.0)
MCHC: 32 g/dL (ref 32.0–36.0)
MCV: 88 fL (ref 80–100)
Monocyte #: 0.6 x10 3/mm (ref 0.2–1.0)
Monocyte %: 11.6 %
NEUTROS PCT: 63.5 %
Neutrophil #: 3.5 x10 3/mm (ref 1.4–6.5)
Platelet: 151 x10 3/mm (ref 150–440)
RBC: 4.35 10*6/uL — ABNORMAL LOW (ref 4.40–5.90)
RDW: 16 % — ABNORMAL HIGH (ref 11.5–14.5)
WBC: 5.5 x10 3/mm (ref 3.8–10.6)

## 2013-09-17 ENCOUNTER — Emergency Department: Payer: Self-pay | Admitting: Emergency Medicine

## 2013-09-18 ENCOUNTER — Ambulatory Visit: Payer: Self-pay | Admitting: Internal Medicine

## 2013-12-29 ENCOUNTER — Ambulatory Visit: Payer: Self-pay | Admitting: Internal Medicine

## 2013-12-30 LAB — CBC CANCER CENTER
Basophil #: 0 x10 3/mm (ref 0.0–0.1)
Basophil %: 0.8 %
Eosinophil #: 0.1 x10 3/mm (ref 0.0–0.7)
Eosinophil %: 1.4 %
HCT: 36 % — ABNORMAL LOW (ref 40.0–52.0)
HGB: 11.6 g/dL — ABNORMAL LOW (ref 13.0–18.0)
LYMPHS PCT: 12.1 %
Lymphocyte #: 0.6 x10 3/mm — ABNORMAL LOW (ref 1.0–3.6)
MCH: 28.4 pg (ref 26.0–34.0)
MCHC: 32.1 g/dL (ref 32.0–36.0)
MCV: 88 fL (ref 80–100)
Monocyte #: 0.6 x10 3/mm (ref 0.2–1.0)
Monocyte %: 11.5 %
NEUTROS PCT: 74.2 %
Neutrophil #: 3.7 x10 3/mm (ref 1.4–6.5)
Platelet: 145 x10 3/mm — ABNORMAL LOW (ref 150–440)
RBC: 4.08 10*6/uL — ABNORMAL LOW (ref 4.40–5.90)
RDW: 15.6 % — ABNORMAL HIGH (ref 11.5–14.5)
WBC: 4.9 x10 3/mm (ref 3.8–10.6)

## 2014-01-16 ENCOUNTER — Ambulatory Visit: Payer: Self-pay | Admitting: Internal Medicine

## 2014-11-25 ENCOUNTER — Emergency Department: Payer: Medicare Other

## 2014-11-25 ENCOUNTER — Inpatient Hospital Stay
Admission: EM | Admit: 2014-11-25 | Discharge: 2014-11-29 | DRG: 481 | Disposition: A | Discharge status: Rehabilitation Facility | Payer: Medicare Other | Attending: Internal Medicine | Admitting: Internal Medicine

## 2014-11-25 ENCOUNTER — Other Ambulatory Visit: Payer: Self-pay

## 2014-11-25 ENCOUNTER — Encounter: Payer: Self-pay | Admitting: Emergency Medicine

## 2014-11-25 DIAGNOSIS — I2510 Atherosclerotic heart disease of native coronary artery without angina pectoris: Secondary | ICD-10-CM | POA: Diagnosis present

## 2014-11-25 DIAGNOSIS — E785 Hyperlipidemia, unspecified: Secondary | ICD-10-CM | POA: Diagnosis present

## 2014-11-25 DIAGNOSIS — F0280 Dementia in other diseases classified elsewhere without behavioral disturbance: Secondary | ICD-10-CM | POA: Diagnosis present

## 2014-11-25 DIAGNOSIS — I5022 Chronic systolic (congestive) heart failure: Secondary | ICD-10-CM | POA: Diagnosis present

## 2014-11-25 DIAGNOSIS — K219 Gastro-esophageal reflux disease without esophagitis: Secondary | ICD-10-CM | POA: Diagnosis present

## 2014-11-25 DIAGNOSIS — E119 Type 2 diabetes mellitus without complications: Secondary | ICD-10-CM | POA: Diagnosis present

## 2014-11-25 DIAGNOSIS — G309 Alzheimer's disease, unspecified: Secondary | ICD-10-CM | POA: Diagnosis present

## 2014-11-25 DIAGNOSIS — Z888 Allergy status to other drugs, medicaments and biological substances status: Secondary | ICD-10-CM | POA: Diagnosis not present

## 2014-11-25 DIAGNOSIS — W19XXXA Unspecified fall, initial encounter: Secondary | ICD-10-CM | POA: Diagnosis present

## 2014-11-25 DIAGNOSIS — I429 Cardiomyopathy, unspecified: Secondary | ICD-10-CM | POA: Diagnosis present

## 2014-11-25 DIAGNOSIS — Z95810 Presence of automatic (implantable) cardiac defibrillator: Secondary | ICD-10-CM | POA: Diagnosis not present

## 2014-11-25 DIAGNOSIS — D62 Acute posthemorrhagic anemia: Secondary | ICD-10-CM | POA: Diagnosis present

## 2014-11-25 DIAGNOSIS — S72009A Fracture of unspecified part of neck of unspecified femur, initial encounter for closed fracture: Secondary | ICD-10-CM | POA: Diagnosis present

## 2014-11-25 DIAGNOSIS — Z951 Presence of aortocoronary bypass graft: Secondary | ICD-10-CM

## 2014-11-25 DIAGNOSIS — S72141A Displaced intertrochanteric fracture of right femur, initial encounter for closed fracture: Secondary | ICD-10-CM | POA: Diagnosis present

## 2014-11-25 DIAGNOSIS — Z880 Allergy status to penicillin: Secondary | ICD-10-CM

## 2014-11-25 DIAGNOSIS — Z7901 Long term (current) use of anticoagulants: Secondary | ICD-10-CM | POA: Diagnosis not present

## 2014-11-25 DIAGNOSIS — Z79899 Other long term (current) drug therapy: Secondary | ICD-10-CM

## 2014-11-25 DIAGNOSIS — Z419 Encounter for procedure for purposes other than remedying health state, unspecified: Secondary | ICD-10-CM

## 2014-11-25 DIAGNOSIS — E039 Hypothyroidism, unspecified: Secondary | ICD-10-CM | POA: Diagnosis present

## 2014-11-25 DIAGNOSIS — Z87891 Personal history of nicotine dependence: Secondary | ICD-10-CM | POA: Diagnosis not present

## 2014-11-25 DIAGNOSIS — Y92019 Unspecified place in single-family (private) house as the place of occurrence of the external cause: Secondary | ICD-10-CM | POA: Diagnosis not present

## 2014-11-25 DIAGNOSIS — I739 Peripheral vascular disease, unspecified: Secondary | ICD-10-CM | POA: Diagnosis present

## 2014-11-25 DIAGNOSIS — I10 Essential (primary) hypertension: Secondary | ICD-10-CM | POA: Diagnosis present

## 2014-11-25 DIAGNOSIS — M1990 Unspecified osteoarthritis, unspecified site: Secondary | ICD-10-CM | POA: Diagnosis present

## 2014-11-25 DIAGNOSIS — R339 Retention of urine, unspecified: Secondary | ICD-10-CM | POA: Diagnosis present

## 2014-11-25 DIAGNOSIS — S72001A Fracture of unspecified part of neck of right femur, initial encounter for closed fracture: Secondary | ICD-10-CM

## 2014-11-25 DIAGNOSIS — Z794 Long term (current) use of insulin: Secondary | ICD-10-CM | POA: Diagnosis not present

## 2014-11-25 DIAGNOSIS — M25551 Pain in right hip: Secondary | ICD-10-CM | POA: Diagnosis present

## 2014-11-25 DIAGNOSIS — I481 Persistent atrial fibrillation: Secondary | ICD-10-CM | POA: Diagnosis present

## 2014-11-25 HISTORY — DX: Unspecified atrial fibrillation: I48.91

## 2014-11-25 HISTORY — DX: Alzheimer's disease, unspecified: G30.9

## 2014-11-25 HISTORY — DX: Hyperlipidemia, unspecified: E78.5

## 2014-11-25 HISTORY — DX: Pre-excitation syndrome: I45.6

## 2014-11-25 HISTORY — DX: Major depressive disorder, single episode, unspecified: F32.9

## 2014-11-25 HISTORY — DX: Essential (primary) hypertension: I10

## 2014-11-25 HISTORY — DX: Depression, unspecified: F32.A

## 2014-11-25 HISTORY — DX: Dementia in other diseases classified elsewhere, unspecified severity, without behavioral disturbance, psychotic disturbance, mood disturbance, and anxiety: F02.80

## 2014-11-25 HISTORY — DX: Peripheral vascular disease, unspecified: I73.9

## 2014-11-25 HISTORY — DX: Atherosclerotic heart disease of native coronary artery without angina pectoris: I25.10

## 2014-11-25 HISTORY — DX: Type 2 diabetes mellitus without complications: E11.9

## 2014-11-25 HISTORY — DX: Unspecified dementia, unspecified severity, without behavioral disturbance, psychotic disturbance, mood disturbance, and anxiety: F03.90

## 2014-11-25 HISTORY — DX: Hypothyroidism, unspecified: E03.9

## 2014-11-25 HISTORY — DX: Chronic systolic (congestive) heart failure: I50.22

## 2014-11-25 LAB — BASIC METABOLIC PANEL
ANION GAP: 10 (ref 5–15)
BUN: 42 mg/dL — ABNORMAL HIGH (ref 6–20)
CO2: 28 mmol/L (ref 22–32)
CREATININE: 1.81 mg/dL — AB (ref 0.61–1.24)
Calcium: 9.3 mg/dL (ref 8.9–10.3)
Chloride: 102 mmol/L (ref 101–111)
GFR calc Af Amer: 37 mL/min — ABNORMAL LOW (ref >60)
GFR, EST NON AFRICAN AMERICAN: 32 mL/min — AB (ref >60)
Glucose, Bld: 153 mg/dL — ABNORMAL HIGH (ref 65–99)
Potassium: 3.9 mmol/L (ref 3.5–5.1)
Sodium: 140 mmol/L (ref 135–145)

## 2014-11-25 LAB — CBC
HCT: 35 % — ABNORMAL LOW (ref 40.0–52.0)
HEMOGLOBIN: 11.6 g/dL — AB (ref 13.0–18.0)
MCH: 28.9 pg (ref 26.0–34.0)
MCHC: 33.3 g/dL (ref 32.0–36.0)
MCV: 86.9 fL (ref 80.0–100.0)
Platelets: 147 10*3/uL — ABNORMAL LOW (ref 150–440)
RBC: 4.03 MIL/uL — AB (ref 4.40–5.90)
RDW: 16.1 % — ABNORMAL HIGH (ref 11.5–14.5)
WBC: 6.6 10*3/uL (ref 3.8–10.6)

## 2014-11-25 LAB — GLUCOSE, CAPILLARY
GLUCOSE-CAPILLARY: 241 mg/dL — AB (ref 65–99)
Glucose-Capillary: 260 mg/dL — ABNORMAL HIGH (ref 65–99)

## 2014-11-25 LAB — ABO/RH: ABO/RH(D): O NEG

## 2014-11-25 LAB — PROTIME-INR
INR: 2.11
PROTHROMBIN TIME: 23.8 s — AB (ref 11.4–15.0)

## 2014-11-25 MED ORDER — ALUM & MAG HYDROXIDE-SIMETH 200-200-20 MG/5ML PO SUSP: 30.0000 mL | Freq: Four times a day (QID) | ORAL | Status: DC | PRN

## 2014-11-25 MED ORDER — ACETAMINOPHEN 325 MG PO TABS: 650.0000 mg | ORAL_TABLET | Freq: Four times a day (QID) | ORAL | Status: DC | PRN

## 2014-11-25 MED ORDER — DOCUSATE SODIUM 100 MG PO CAPS
300.0000 mg | ORAL_CAPSULE | Freq: Every day | ORAL | Status: DC
  Administered 2014-11-25: 300 mg via ORAL
  Filled 2014-11-25: qty 3

## 2014-11-25 MED ORDER — CYANOCOBALAMIN 1000 MCG/ML IJ SOLN
1000.0000 ug | INTRAMUSCULAR | Status: DC
  Administered 2014-11-25: 1000 ug via INTRAMUSCULAR
  Filled 2014-11-25: qty 1

## 2014-11-25 MED ORDER — PANTOPRAZOLE SODIUM 40 MG PO TBEC
40.0000 mg | DELAYED_RELEASE_TABLET | Freq: Every day | ORAL | Status: DC
  Administered 2014-11-25 – 2014-11-29 (×4): 40 mg via ORAL
  Filled 2014-11-25 (×4): qty 1

## 2014-11-25 MED ORDER — SIMVASTATIN 20 MG PO TABS
20.0000 mg | ORAL_TABLET | Freq: Every day | ORAL | Status: DC
  Administered 2014-11-25 – 2014-11-28 (×4): 20 mg via ORAL
  Filled 2014-11-25 (×4): qty 1

## 2014-11-25 MED ORDER — TRAZODONE HCL 50 MG PO TABS
50.0000 mg | ORAL_TABLET | Freq: Every day | ORAL | Status: AC
  Administered 2014-11-25: 50 mg via ORAL
  Filled 2014-11-25: qty 1

## 2014-11-25 MED ORDER — SPIRONOLACTONE 12.5 MG HALF TABLET
12.5000 mg | ORAL_TABLET | Freq: Every morning | ORAL | Status: DC
  Filled 2014-11-25: qty 1

## 2014-11-25 MED ORDER — SEVELAMER CARBONATE 800 MG PO TABS
800.0000 mg | ORAL_TABLET | Freq: Two times a day (BID) | ORAL | Status: DC
  Administered 2014-11-25 – 2014-11-29 (×6): 800 mg via ORAL
  Filled 2014-11-25 (×9): qty 1

## 2014-11-25 MED ORDER — ISOSORBIDE DINITRATE 10 MG PO TABS
10.0000 mg | ORAL_TABLET | Freq: Two times a day (BID) | ORAL | Status: DC
  Administered 2014-11-25: 10 mg via ORAL
  Filled 2014-11-25 (×3): qty 1

## 2014-11-25 MED ORDER — HYDROCODONE-ACETAMINOPHEN 5-325 MG PO TABS
1.0000 | ORAL_TABLET | Freq: Four times a day (QID) | ORAL | Status: DC | PRN
  Administered 2014-11-25: 1 via ORAL
  Administered 2014-11-26: 2 via ORAL
  Filled 2014-11-25: qty 2
  Filled 2014-11-25: qty 1

## 2014-11-25 MED ORDER — DONEPEZIL HCL 5 MG PO TABS
10.0000 mg | ORAL_TABLET | Freq: Every morning | ORAL | Status: DC
  Administered 2014-11-27 – 2014-11-29 (×3): 10 mg via ORAL
  Filled 2014-11-25 (×3): qty 2

## 2014-11-25 MED ORDER — ACETAMINOPHEN 650 MG RE SUPP: 650.0000 mg | Freq: Four times a day (QID) | RECTAL | Status: DC | PRN

## 2014-11-25 MED ORDER — HYDROMORPHONE HCL 1 MG/ML IJ SOLN
0.5000 mg | INTRAMUSCULAR | Status: DC | PRN
  Administered 2014-11-25: 0.5 mg via INTRAVENOUS

## 2014-11-25 MED ORDER — ONDANSETRON HCL 4 MG PO TABS: 4.0000 mg | ORAL_TABLET | Freq: Four times a day (QID) | ORAL | Status: DC | PRN

## 2014-11-25 MED ORDER — MEMANTINE HCL 10 MG PO TABS
10.0000 mg | ORAL_TABLET | Freq: Two times a day (BID) | ORAL | Status: DC
  Administered 2014-11-25 – 2014-11-29 (×7): 10 mg via ORAL
  Filled 2014-11-25 (×9): qty 1

## 2014-11-25 MED ORDER — FERROUS FUMARATE 325 (106 FE) MG PO TABS: 1.0000 | ORAL_TABLET | ORAL | Status: DC

## 2014-11-25 MED ORDER — LEVOTHYROXINE SODIUM 112 MCG PO TABS
112.0000 ug | ORAL_TABLET | Freq: Every morning | ORAL | Status: DC
  Administered 2014-11-27 – 2014-11-29 (×3): 112 ug via ORAL
  Filled 2014-11-25 (×3): qty 1

## 2014-11-25 MED ORDER — MORPHINE SULFATE 2 MG/ML IJ SOLN
2.0000 mg | Freq: Four times a day (QID) | INTRAMUSCULAR | Status: DC | PRN
Start: 2014-11-25 — End: 2014-11-26
  Administered 2014-11-25 – 2014-11-26 (×2): 2 mg via INTRAVENOUS
  Filled 2014-11-25 (×2): qty 1

## 2014-11-25 MED ORDER — INSULIN ASPART 100 UNIT/ML ~~LOC~~ SOLN
0.0000 [IU] | Freq: Three times a day (TID) | SUBCUTANEOUS | Status: DC
  Administered 2014-11-25: 5 [IU] via SUBCUTANEOUS
  Administered 2014-11-25: 3 [IU] via SUBCUTANEOUS
  Administered 2014-11-26: 1 [IU] via SUBCUTANEOUS
  Administered 2014-11-26: 2 [IU] via SUBCUTANEOUS
  Administered 2014-11-27 (×2): 1 [IU] via SUBCUTANEOUS
  Administered 2014-11-28 – 2014-11-29 (×2): 2 [IU] via SUBCUTANEOUS
  Filled 2014-11-25 (×2): qty 2
  Filled 2014-11-25: qty 1
  Filled 2014-11-25: qty 5
  Filled 2014-11-25: qty 2
  Filled 2014-11-25: qty 1
  Filled 2014-11-25: qty 3
  Filled 2014-11-25: qty 1
  Filled 2014-11-25: qty 3
  Filled 2014-11-25: qty 2

## 2014-11-25 MED ORDER — TORSEMIDE 20 MG PO TABS
40.0000 mg | ORAL_TABLET | Freq: Every morning | ORAL | Status: DC
  Administered 2014-11-27 – 2014-11-29 (×3): 40 mg via ORAL
  Filled 2014-11-25 (×3): qty 2

## 2014-11-25 MED ORDER — SENNOSIDES-DOCUSATE SODIUM 8.6-50 MG PO TABS: 1.0000 | ORAL_TABLET | Freq: Every evening | ORAL | Status: DC | PRN

## 2014-11-25 MED ORDER — VITAMIN K1 10 MG/ML IJ SOLN
10.0000 mg | Freq: Once | INTRAVENOUS | Status: AC
  Administered 2014-11-25: 10 mg via INTRAVENOUS
  Filled 2014-11-25: qty 1

## 2014-11-25 MED ORDER — SERTRALINE HCL 25 MG PO TABS
25.0000 mg | ORAL_TABLET | Freq: Every morning | ORAL | Status: DC
  Administered 2014-11-27 – 2014-11-29 (×3): 25 mg via ORAL
  Filled 2014-11-25 (×3): qty 1

## 2014-11-25 MED ORDER — HYDROMORPHONE HCL 1 MG/ML IJ SOLN
INTRAMUSCULAR | Status: AC
  Administered 2014-11-25: 0.5 mg via INTRAVENOUS
  Filled 2014-11-25: qty 1

## 2014-11-25 MED ORDER — ONDANSETRON HCL 4 MG/2ML IJ SOLN: 4.0000 mg | Freq: Four times a day (QID) | INTRAMUSCULAR | Status: DC | PRN

## 2014-11-25 MED ORDER — INSULIN ASPART PROT & ASPART (70-30 MIX) 100 UNIT/ML ~~LOC~~ SUSP
15.0000 [IU] | Freq: Every day | SUBCUTANEOUS | Status: DC
  Administered 2014-11-27 – 2014-11-29 (×3): 15 [IU] via SUBCUTANEOUS
  Filled 2014-11-25: qty 10
  Filled 2014-11-25: qty 150
  Filled 2014-11-25 (×3): qty 10

## 2014-11-25 NOTE — ED Provider Notes (Signed)
[name]  ____________________________________________  Time seen: 1340 upon arrival with EMS.  I have reviewed the triage vital signs and the nursing notes.  History May be limited due to patient's apparent dementia. He is alert and communicative and jovial. Additional history by EMS.  HISTORY  Chief Complaint Fall  found on floor by son. Pain in right hip. Deformity right hip.     HPI Tim Myers is a 81 y.o. male who was found on the floor by his son. EMS was called. There is notable pain in the right hip. It is unclear if the patient fell or not or what else may have occurred. The patient says he was simply napping. He is alert and communicative though a little bit disoriented. He continues to make very humorous jokes and is not in any acute distress unless he attempts to move the right leg.    Past Medical History  Diagnosis Date  . Diabetes mellitus without complication   . Hypertension   . Dementia     Notable scar from pubic bone to sternal notch. Patient reports he had an open heart surgery.  There are no active problems to display for this patient.   Past Surgical History  Procedure Laterality Date  . Coronary artery bypass graft    . Abdominal aortic aneurysm repair    . Pacemaker placement      defib/pacemaker  . Implantable cardioverter defibrillator implant      No current outpatient prescriptions on file.  Allergies Penicillins  History reviewed. No pertinent family history.  Social History History  Substance Use Topics  . Smoking status: Former Games developer  . Smokeless tobacco: Not on file  . Alcohol Use: No    Review of Systems Limited review of systems due to patient's dementia, but patient does look well and denies other ailments. Constitutional: Negative for fever. Cardiovascular: Negative for chest pain. "I had open heart surgery" Respiratory: Negative for shortness of  breath. Gastrointestinal: Negative for abdominal pain, vomiting and diarrhea. Genitourinary: Negative for dysuria. Musculoskeletal: Negative for back pain. Skin: Negative for rash. Neurological: Negative for headaches   10-point ROS otherwise negative.  ____________________________________________   PHYSICAL EXAM:  VITAL SIGNS: ED Triage Vitals  Enc Vitals Group     BP --      Pulse --      Resp --      Temp --      Temp src --      SpO2 --      Weight --      Height --      Head Cir --      Peak Flow --      Pain Score --      Pain Loc --      Pain Edu? --      Excl. in GC? --     Constitutional: Alert, pleasant, jovial, a little bit disoriented, unable to give adequate history. ENT   Head: Normocephalic and atraumatic.   Nose: No congestion/rhinnorhea.   Mouth/Throat: Mucous membranes are moist. Cardiovascular: Normal rate, regular rhythm. Respiratory: Normal respiratory effort without tachypnea. Breath sounds are clear and equal bilaterally. No wheezes/rales/rhonchi. Gastrointestinal: Soft and nontender. No distention.  Musculoskeletal: Deformity to right hip with the right leg being slightly shortened and internally rotated. The right hip itself is tender upon examination, palpation. Neurologic:  Verbal, communicative, but offers little history. Makes frequent jokes. Sensation is intact in all 4 extremities.  Motion is reduced in the right extremity presumably due to the injury to the right hip.  Skin:  Skin is warm, dry. No rash noted. Psychiatric: Jovial mood, interactive, somewhat confused..  ____________________________________________    LABS (pertinent positives/negatives)  Metabolic panel: Elevated BUN of 42, creatinine 1.8. Sodium and potassium are within normal levels. CBC: Hemoglobin 11.6, the BB C6.6, platelets 147. INR 2.1, PTT 23.8  ____________________________________________   EKG  ED ECG REPORT I, Amily Depp W, the attending  physician, personally viewed and interpreted this ECG.   Date: 11/25/2014  EKG Time: 1350  Rate: 78  Rhythm: Electronically paced, no further interpretation performed   ____________________________________________    RADIOLOGY  Right hip: IMPRESSION: Intertrochanteric RIGHT femur fracture.  ____________________________________________   PROCEDURES  ____________________________________________   INITIAL IMPRESSION / ASSESSMENT AND PLAN / ED COURSE  Based on clinical exam, the patient most likely has a right hip fracture. We will treat his pain with Dilaudid IV, and obtaining x-rays.  ----------------------------------------- 2:35 PM on 11/25/2014 -----------------------------------------  Patient does have a right hip fracture, intertrochanteric. We will speak with orthopedics and internal medicine admitted to the hospital for further treatment.  ----------------------------------------- 2:58 PM on 11/25/2014 -----------------------------------------  Discussed with Dr. Martha Clan orthopedics.  ____________________________________________   FINAL CLINICAL IMPRESSION(S) / ED DIAGNOSES  Final diagnoses:  Right hip pain  Hip fracture, right, closed, initial encounter      Darien Ramus, MD 11/25/14 1458

## 2014-11-25 NOTE — ED Notes (Signed)
Pt was found on floor by son. Pt disoriented to time.  Poor historian, does not remember event.

## 2014-11-25 NOTE — H&P (Signed)
[facility] [place] [facility]   PATIENT NAME: Tim Myers    MR#:  [id number]  DATE OF BIRTH:  07/04/1924  DATE OF ADMISSION:  11/25/2014  PRIMARY CARE PHYSICIAN: Lauro Regulus., MD   REQUESTING/REFERRING PHYSICIAN: Dr. Carollee Massed  CHIEF COMPLAINT:   Fall and hip pain HISTORY OF PRESENT ILLNESS:  Tim Myers  is a 81 y.o. male with a known history of dementia, hypertension and diabetes mellitus has sustained a fall today.It was unclear at what time patient fell as he was found on the living room floor by his son  X-ray has revealed right hip fracture. Patient takes Coumadin and his INR is at 2.11 in the ED. ED physician has notified on call  orthopedics doctor Dr. Martha Clan regarding the admission PAST MEDICAL HISTORY:   Past Medical History  Diagnosis Date  . Diabetes mellitus without complication   . Hypertension   . Dementia     PAST SURGICAL HISTOIRY:   Past Surgical History  Procedure Laterality Date  . Coronary artery bypass graft    . Abdominal aortic aneurysm repair    . Pacemaker placement      defib/pacemaker  . Implantable cardioverter defibrillator implant      SOCIAL HISTORY:   History  Substance Use Topics  . Smoking status: Former Games developer  . Smokeless tobacco: Not on file  . Alcohol Use: No    FAMILY HISTORY:  History reviewed. No pertinent family history.  DRUG ALLERGIES:   Allergies  Allergen Reactions  . Erythromycin Other (See Comments)    Reaction:  Unknown   . Penicillins Swelling    REVIEW OF SYSTEMS:  CONSTITUTIONAL: No fever, fatigue or weakness.  EYES: No blurred or double vision.  EARS, NOSE, AND THROAT: No tinnitus or ear pain.  RESPIRATORY: No cough, shortness of breath, wheezing or hemoptysis.  CARDIOVASCULAR: No chest pain, orthopnea, edema.  GASTROINTESTINAL: No nausea, vomiting, diarrhea or abdominal pain.  GENITOURINARY: No dysuria, hematuria.  ENDOCRINE: No polyuria, nocturia,   HEMATOLOGY: No anemia, easy bruising or bleeding SKIN: No rash or lesion. MUSCULOSKELETAL: Reporting right hip pain  NEUROLOGIC: No tingling, numbness, weakness. Chronic history of dementia PSYCHIATRY: No anxiety or depression.   MEDICATIONS AT HOME:   Prior to Admission medications   Medication Sig Start Date End Date Taking? Authorizing Provider  cyanocobalamin (,VITAMIN B-12,) 1000 MCG/ML injection Inject 1,000 mcg into the muscle every 30 (thirty) days.   Yes Historical Provider, MD  docusate sodium (COLACE) 100 MG capsule Take 300 mg by mouth at bedtime.   Yes Historical Provider, MD  donepezil (ARICEPT) 10 MG tablet Take 10 mg by mouth every morning.   Yes Historical Provider, MD  ferrous fumarate-iron polysaccharide complex (TANDEM) 162-115.2 MG CAPS Take 1 capsule by mouth See admin instructions. Pt takes on Monday, Wednesday, and Friday.   Yes Historical Provider, MD  insulin NPH-regular Human (HUMULIN 70/30) (70-30) 100 UNIT/ML injection Inject 10-15 Units into the skin 2 (two) times daily with a meal. Pt uses 15 units with breakfast and 10 units with dinner.   Yes Historical Provider, MD  isosorbide dinitrate (ISORDIL) 10 MG tablet Take 10 mg by mouth 2 (two) times daily.   Yes Historical Provider, MD  levothyroxine (SYNTHROID, LEVOTHROID) 112 MCG tablet Take 112 mcg by mouth every morning.   Yes Historical Provider, MD  memantine (NAMENDA) 10 MG tablet Take 10 mg by mouth 2 (two) times daily.    Yes Historical Provider, MD  Multiple Vitamins-Minerals (PRESERVISION  AREDS PO) Take 1 capsule by mouth 2 (two) times daily.    Yes Historical Provider, MD  omeprazole (PRILOSEC) 20 MG capsule Take 20 mg by mouth 2 (two) times daily.    Yes Historical Provider, MD  sertraline (ZOLOFT) 25 MG tablet Take 25 mg by mouth every morning.   Yes Historical Provider, MD  sevelamer carbonate (RENVELA) 800 MG tablet Take 800 mg by mouth 2 (two) times daily.   Yes Historical Provider, MD  simvastatin  (ZOCOR) 40 MG tablet Take 20 mg by mouth at bedtime.   Yes Historical Provider, MD  spironolactone (ALDACTONE) 25 MG tablet Take 12.5 mg by mouth every morning.   Yes Historical Provider, MD  torsemide (DEMADEX) 20 MG tablet Take 40 mg by mouth every morning.   Yes Historical Provider, MD  traMADol (ULTRAM) 50 MG tablet Take 50 mg by mouth every 6 (six) hours as needed for moderate pain.   Yes Historical Provider, MD  warfarin (COUMADIN) 6 MG tablet Take 6 mg by mouth at bedtime.   Yes Historical Provider, MD      VITAL SIGNS:  Blood pressure 109/95, pulse 76, temperature 97.7 F (36.5 C), temperature source Oral, resp. rate 13, height 5\' 9"  (1.753 m), weight 78.427 kg (172 lb 14.4 oz), SpO2 98 %.  PHYSICAL EXAMINATION:  GENERAL:  81 y.o.-year-old patient lying in the bed with no acute distress.  EYES: Pupils equal, round, reactive to light and accommodation. No scleral icterus. Extraocular muscles intact.  HEENT: Head atraumatic, normocephalic. Oropharynx and nasopharynx clear.  NECK:  Supple, no jugular venous distention. No thyroid enlargement, no tenderness.  LUNGS: Normal breath sounds bilaterally, no wheezing, rales,rhonchi or crepitation. No use of accessory muscles of respiration.  CARDIOVASCULAR: S1, S2 normal. No murmurs, rubs, or gallops.  ABDOMEN: Soft, nontender, nondistended. Bowel sounds present. No organomegaly or mass.  EXTREMITIES: No pedal edema, cyanosis, or clubbing. Right hip is externally rotated and abducted  NEUROLOGIC: Awake and alert. Sensation intact. Gait not checked.  PSYCHIATRIC: The patient is alert and oriented x 2.  SKIN: No obvious rash, or ulcer.   LABORATORY PANEL:   CBC  Recent Labs Lab 11/25/14 1354  WBC 6.6  HGB 11.6*  HCT 35.0*  PLT 147*   ------------------------------------------------------------------------------------------------------------------  Chemistries   Recent Labs Lab 11/25/14 1354  NA 140  K 3.9  CL 102  CO2 28   GLUCOSE 153*  BUN 42*  CREATININE 1.81*  CALCIUM 9.3   ------------------------------------------------------------------------------------------------------------------  Cardiac Enzymes No results for input(s): TROPONINI in the last 168 hours. ------------------------------------------------------------------------------------------------------------------  RADIOLOGY:  Dg Chest Port 1 View  11/25/2014   CLINICAL DATA:  Pre operative respiratory exam.  Right hip fracture.  EXAM: PORTABLE CHEST - 1 VIEW  COMPARISON:  06/22/2011  FINDINGS: Heart size and pulmonary vascularity are normal. Tortuosity and calcification of the thoracic aorta. Slight no infiltrates or effusions. CABG. Pacemaker and AICD. Osteopenia. No acute osseous abnormality. Nodular density in the left midzone is unchanged and probably represents an end on blood vessel.  IMPRESSION: No acute abnormality.   Electronically Signed   By: Francene Boyers M.D.   On: 11/25/2014 14:38   Dg Hip Unilat With Pelvis 2-3 Views Right  11/25/2014   CLINICAL DATA:  Patient found down. RIGHT hip pain. Initial encounter.  EXAM: RIGHT HIP (WITH PELVIS) 2-3 VIEWS  COMPARISON:  None.  FINDINGS: Intertrochanteric RIGHT femur fracture is present. Diffuse osteopenia. The RIGHT femoral head remains located. Aortoiliac stent graft is present with embolization  coils in the RIGHT anatomic pelvis. Severe atherosclerosis.  The obturator rings appear intact.  IMPRESSION: Intertrochanteric RIGHT femur fracture.   Electronically Signed   By: Andreas Newport M.D.   On: 11/25/2014 14:30    EKG:   Orders placed or performed during the hospital encounter of 11/25/14  . ED EKG  . ED EKG   EKG has revealed paced rhythm at 72 IMPRESSION AND PLAN:    #1 Right hip pain secondary to right intertrochanteric fracture  Admit to MedSurg unit Orthopedic placed to Dr. Martha Clan  Pain management  discontinued  Coumadin   check PT/INR in a.m.  Patient might need FFP to  reverse the Coumadin   patient will be medically cleared for surgery when INR is less than 1.5   #2 chronic history of congestive heart failure Patient is not currently fluid overloaded. Resume home medications. Daily weight monitoring. Monitor intake and output Continue home medication spironolactone and torsemide   #3 history of diabetes mellitus Continue insulin human 70/30 15 units a.m. and 10 units at dinnertime Continue sliding scale insulin  #4 history of coronary artery disease status post CABG Currently patient is asymptomatic. Followed up by [facility] cardiology Dr. Ardyth Harps If needed will put a cardiology consult.  #5 history of dementia-continue Namenda  #6 history of hypertension  All the records are reviewed and case discussed with ED provider. Management plans discussed with the patient, family and they are in agreement.  CODE STATUS: full code, wife is a medical power of attorney   TOTAL TIME TAKING CARE OF THIS PATIENT: 29  Tim Myers M.D on 11/25/2014 at 5:04 PM  Between 7am to 6pm - Pager - [phone]  After 6pm go to [email or web address] - password [email or web address] [facility]  [place] [facility]  Office  [phone]  CC: Primary care physician; Lauro Regulus., MD

## 2014-11-25 NOTE — Progress Notes (Signed)
Spoke with Dr Betti Cruz  Requesting order for trazadone 50 mg po at bedtime. Order received .

## 2014-11-25 NOTE — Consult Note (Signed)
ORTHOPAEDIC CONSULTATION  REQUESTING PHYSICIAN: Ramonita Lab, MD  Chief Complaint: Right hip pain s/p fall.  HPI: Tim Myers is a 81 y.o. male who complains of  Right hip pain s/p unwitnessed fall.  Patient has dementia and is unable to explain how he fell.  His son found him at his home on the ground.  Patient is seen in his hospital room surrounded by his family.  He has only mild right hip pain currently.  Patient's nurse, Tangelo Park, is in the room during this patient encounter.  Past Medical History  Diagnosis Date  . Diabetes mellitus without complication   . Hypertension   . Dementia    Past Surgical History  Procedure Laterality Date  . Coronary artery bypass graft    . Abdominal aortic aneurysm repair    . Pacemaker placement      defib/pacemaker  . Implantable cardioverter defibrillator implant     History   Social History  . Marital Status: Married    Spouse Name: N/A  . Number of Children: N/A  . Years of Education: N/A   Social History Main Topics  . Smoking status: Former Games developer  . Smokeless tobacco: Not on file  . Alcohol Use: No  . Drug Use: No  . Sexual Activity: Not on file   Other Topics Concern  . None   Social History Narrative  . None   History reviewed. No pertinent family history. Allergies  Allergen Reactions  . Erythromycin Other (See Comments)    Reaction:  Unknown   . Penicillins Swelling   Prior to Admission medications   Medication Sig Start Date End Date Taking? Authorizing Provider  cyanocobalamin (,VITAMIN B-12,) 1000 MCG/ML injection Inject 1,000 mcg into the muscle every 30 (thirty) days.   Yes Historical Provider, MD  docusate sodium (COLACE) 100 MG capsule Take 300 mg by mouth at bedtime.   Yes Historical Provider, MD  donepezil (ARICEPT) 10 MG tablet Take 10 mg by mouth every morning.   Yes Historical Provider, MD  ferrous fumarate-iron polysaccharide complex (TANDEM) 162-115.2 MG CAPS Take 1 capsule by mouth See admin  instructions. Pt takes on Monday, Wednesday, and Friday.   Yes Historical Provider, MD  insulin NPH-regular Human (HUMULIN 70/30) (70-30) 100 UNIT/ML injection Inject 10-15 Units into the skin 2 (two) times daily with a meal. Pt uses 15 units with breakfast and 10 units with dinner.   Yes Historical Provider, MD  isosorbide dinitrate (ISORDIL) 10 MG tablet Take 10 mg by mouth 2 (two) times daily.   Yes Historical Provider, MD  levothyroxine (SYNTHROID, LEVOTHROID) 112 MCG tablet Take 112 mcg by mouth every morning.   Yes Historical Provider, MD  memantine (NAMENDA) 10 MG tablet Take 10 mg by mouth 2 (two) times daily.    Yes Historical Provider, MD  Multiple Vitamins-Minerals (PRESERVISION AREDS PO) Take 1 capsule by mouth 2 (two) times daily.    Yes Historical Provider, MD  omeprazole (PRILOSEC) 20 MG capsule Take 20 mg by mouth 2 (two) times daily.    Yes Historical Provider, MD  sertraline (ZOLOFT) 25 MG tablet Take 25 mg by mouth every morning.   Yes Historical Provider, MD  sevelamer carbonate (RENVELA) 800 MG tablet Take 800 mg by mouth 2 (two) times daily.   Yes Historical Provider, MD  simvastatin (ZOCOR) 40 MG tablet Take 20 mg by mouth at bedtime.   Yes Historical Provider, MD  spironolactone (ALDACTONE) 25 MG tablet Take 12.5 mg by mouth every morning.  Yes Historical Provider, MD  torsemide (DEMADEX) 20 MG tablet Take 40 mg by mouth every morning.   Yes Historical Provider, MD  traMADol (ULTRAM) 50 MG tablet Take 50 mg by mouth every 6 (six) hours as needed for moderate pain.   Yes Historical Provider, MD  warfarin (COUMADIN) 6 MG tablet Take 6 mg by mouth at bedtime.   Yes Historical Provider, MD   Dg Chest Port 1 View  11/25/2014   CLINICAL DATA:  Pre operative respiratory exam.  Right hip fracture.  EXAM: PORTABLE CHEST - 1 VIEW  COMPARISON:  06/22/2011  FINDINGS: Heart size and pulmonary vascularity are normal. Tortuosity and calcification of the thoracic aorta. Slight no infiltrates  or effusions. CABG. Pacemaker and AICD. Osteopenia. No acute osseous abnormality. Nodular density in the left midzone is unchanged and probably represents an end on blood vessel.  IMPRESSION: No acute abnormality.   Electronically Signed   By: Francene Boyers M.D.   On: 11/25/2014 14:38   Dg Hip Unilat With Pelvis 2-3 Views Right  11/25/2014   CLINICAL DATA:  Patient found down. RIGHT hip pain. Initial encounter.  EXAM: RIGHT HIP (WITH PELVIS) 2-3 VIEWS  COMPARISON:  None.  FINDINGS: Intertrochanteric RIGHT femur fracture is present. Diffuse osteopenia. The RIGHT femoral head remains located. Aortoiliac stent graft is present with embolization coils in the RIGHT anatomic pelvis. Severe atherosclerosis.  The obturator rings appear intact.  IMPRESSION: Intertrochanteric RIGHT femur fracture.   Electronically Signed   By: Andreas Newport M.D.   On: 11/25/2014 14:30    Positive ROS: All other systems have been reviewed and were otherwise negative with the exception of those mentioned in the HPI and as above.  Physical Exam: General: Drowsy but arousable Neurologic: Sensation intact distally  MUSCULOSKELETAL: Right hip:  + ecchymosis over lateral right hip.  Skin intact.  Thigh and leg compartments are soft and compressible.  Pedal pulses are palpable.  He can flex and extend toes and ankle.  No lower extremity edema seen.  Right LE is shortened and externally rotated.    Assessment: Right intertrochanteric hip fracture, displaced  Plan: I have explained the details of the fracture to the patient and his family.  I diagramed the fracture and proposed surgery on the white board in the patient's room.  I have recommended intramedullary fixation for this fracture.  I explained in detail the surgery and the post-op course to them.    The risks benefits and alternatives were discussed with the patient preoperatively including but not limited to infection, bleeding, nerve or blood vessel injury, persistent  hip pain,  malunion, nonunion, avascular necrosis, change in lower extremity rotation, leg length discrepancy, failure of the hardware and the need for revision surgery, including the potential for conversion to hemi or total hip arthroplasty. Medical risks include but are not limited to: DVT and pulmonary embolism, myocardial infarction, stroke, pneumonia, respiratory failure and death.  The patient understood and agreed with the plan for surgery.  I also explained the risks of benefits of blood transfusion.  Surgery will be scheduled for tomorrow.  The patient is on coumadin and has an INR of 2.11.  I have ordered  of IV Vitamin K.  INR will be rechecked in the AM.  Patient has been cleared for surgery by Dr. Amado Coe from hospitalist service pending an INR less than 1.5.  I have reviewed the labs and radiographic studies for this patient in preparation of this surgery.  I answered all questions  by the patient and his family.   Juanell Fairly, MD    11/25/2014 7:30 PM

## 2014-11-25 NOTE — ED Notes (Signed)
Patient transported to CT 

## 2014-11-26 ENCOUNTER — Encounter
Admission: EM | Disposition: A | Discharge status: Rehabilitation Facility | Payer: Self-pay | Source: Home / Self Care | Attending: Internal Medicine

## 2014-11-26 ENCOUNTER — Inpatient Hospital Stay: Payer: Medicare Other

## 2014-11-26 ENCOUNTER — Inpatient Hospital Stay: Payer: Medicare Other | Admitting: Anesthesiology

## 2014-11-26 ENCOUNTER — Encounter: Payer: Self-pay | Admitting: Internal Medicine

## 2014-11-26 HISTORY — PX: FEMUR IM NAIL: SHX1597

## 2014-11-26 LAB — CBC
HCT: 30.3 % — ABNORMAL LOW (ref 40.0–52.0)
Hemoglobin: 10 g/dL — ABNORMAL LOW (ref 13.0–18.0)
MCH: 28.7 pg (ref 26.0–34.0)
MCHC: 32.9 g/dL (ref 32.0–36.0)
MCV: 87.3 fL (ref 80.0–100.0)
PLATELETS: 111 10*3/uL — AB (ref 150–440)
RBC: 3.47 MIL/uL — ABNORMAL LOW (ref 4.40–5.90)
RDW: 15.8 % — AB (ref 11.5–14.5)
WBC: 6.6 10*3/uL (ref 3.8–10.6)

## 2014-11-26 LAB — BASIC METABOLIC PANEL
Anion gap: 7 (ref 5–15)
BUN: 44 mg/dL — ABNORMAL HIGH (ref 6–20)
CHLORIDE: 103 mmol/L (ref 101–111)
CO2: 29 mmol/L (ref 22–32)
Calcium: 8.5 mg/dL — ABNORMAL LOW (ref 8.9–10.3)
Creatinine, Ser: 1.78 mg/dL — ABNORMAL HIGH (ref 0.61–1.24)
GFR calc non Af Amer: 32 mL/min — ABNORMAL LOW (ref >60)
GFR, EST AFRICAN AMERICAN: 38 mL/min — AB (ref >60)
Glucose, Bld: 143 mg/dL — ABNORMAL HIGH (ref 65–99)
Potassium: 3.9 mmol/L (ref 3.5–5.1)
Sodium: 139 mmol/L (ref 135–145)

## 2014-11-26 LAB — GLUCOSE, CAPILLARY
GLUCOSE-CAPILLARY: 154 mg/dL — AB (ref 65–99)
GLUCOSE-CAPILLARY: 185 mg/dL — AB (ref 65–99)
GLUCOSE-CAPILLARY: 153 mg/dL — AB (ref 65–99)
Glucose-Capillary: 145 mg/dL — ABNORMAL HIGH (ref 65–99)

## 2014-11-26 LAB — PROTIME-INR
INR: 1.49
INR: 1.33
Prothrombin Time: 18.2 seconds — ABNORMAL HIGH (ref 11.4–15.0)
Prothrombin Time: 16.7 seconds — ABNORMAL HIGH (ref 11.4–15.0)

## 2014-11-26 SURGERY — INSERTION, INTRAMEDULLARY ROD, FEMUR
Anesthesia: Spinal | Site: Hip | Laterality: Right

## 2014-11-26 MED ORDER — LIDOCAINE HCL (CARDIAC) 20 MG/ML IV SOLN
INTRAVENOUS | Status: DC | PRN
  Administered 2014-11-26: 40 mg via INTRAVENOUS

## 2014-11-26 MED ORDER — WARFARIN - PHARMACIST DOSING INPATIENT: Freq: Every day | Status: DC

## 2014-11-26 MED ORDER — SODIUM CHLORIDE 0.9 % IV SOLN
INTRAVENOUS | Status: DC | PRN
  Administered 2014-11-26: 12:00:00 via INTRAVENOUS

## 2014-11-26 MED ORDER — WARFARIN SODIUM 2 MG PO TABS: 6.0000 mg | ORAL_TABLET | Freq: Once | ORAL | Status: DC

## 2014-11-26 MED ORDER — CEFAZOLIN SODIUM-DEXTROSE 2-3 GM-% IV SOLR
2.0000 g | Freq: Four times a day (QID) | INTRAVENOUS | Status: AC
  Administered 2014-11-26 (×2): 2 g via INTRAVENOUS
  Filled 2014-11-26 (×2): qty 50

## 2014-11-26 MED ORDER — ONDANSETRON HCL 4 MG/2ML IJ SOLN: 4.0000 mg | Freq: Once | INTRAMUSCULAR | Status: AC | PRN

## 2014-11-26 MED ORDER — OXYCODONE HCL 5 MG PO TABS
5.0000 mg | ORAL_TABLET | ORAL | Status: DC | PRN
  Administered 2014-11-26 – 2014-11-27 (×2): 5 mg via ORAL
  Administered 2014-11-27 (×3): 10 mg via ORAL
  Administered 2014-11-28: 5 mg via ORAL
  Administered 2014-11-29: 10 mg via ORAL
  Administered 2014-11-29: 5 mg via ORAL
  Filled 2014-11-26: qty 1
  Filled 2014-11-26 (×3): qty 2
  Filled 2014-11-26 (×2): qty 1
  Filled 2014-11-26: qty 2
  Filled 2014-11-26: qty 1

## 2014-11-26 MED ORDER — ACETAMINOPHEN 650 MG RE SUPP
650.0000 mg | Freq: Four times a day (QID) | RECTAL | Status: DC | PRN
Start: 2014-11-26 — End: 2014-11-29

## 2014-11-26 MED ORDER — ACETAMINOPHEN 325 MG PO TABS
650.0000 mg | ORAL_TABLET | Freq: Four times a day (QID) | ORAL | Status: DC | PRN
  Administered 2014-11-28: 650 mg via ORAL
  Filled 2014-11-26: qty 2

## 2014-11-26 MED ORDER — BUPIVACAINE HCL (PF) 0.5 % IJ SOLN
INTRAMUSCULAR | Status: DC | PRN
  Administered 2014-11-26: 2.5 mL

## 2014-11-26 MED ORDER — PROPOFOL INFUSION 10 MG/ML OPTIME
INTRAVENOUS | Status: DC | PRN
  Administered 2014-11-26: 30 ug/kg/min via INTRAVENOUS

## 2014-11-26 MED ORDER — PHENOL 1.4 % MT LIQD: 1.0000 | OROMUCOSAL | Status: DC | PRN

## 2014-11-26 MED ORDER — ONDANSETRON HCL 4 MG PO TABS: 4.0000 mg | ORAL_TABLET | Freq: Four times a day (QID) | ORAL | Status: DC | PRN

## 2014-11-26 MED ORDER — FENTANYL CITRATE (PF) 100 MCG/2ML IJ SOLN
INTRAMUSCULAR | Status: DC | PRN
  Administered 2014-11-26: 25 ug via INTRAVENOUS

## 2014-11-26 MED ORDER — NEOMYCIN-POLYMYXIN B GU 40-200000 IR SOLN
Status: DC | PRN
  Administered 2014-11-26: 2 mL

## 2014-11-26 MED ORDER — VITAMIN K1 10 MG/ML IJ SOLN
10.0000 mg | Freq: Once | INTRAVENOUS | Status: AC
  Administered 2014-11-26: 10 mg via INTRAVENOUS
  Filled 2014-11-26 (×2): qty 1

## 2014-11-26 MED ORDER — BISACODYL 10 MG RE SUPP
10.0000 mg | Freq: Every day | RECTAL | Status: DC | PRN
  Administered 2014-11-28: 10 mg via RECTAL
  Filled 2014-11-26: qty 1

## 2014-11-26 MED ORDER — ONDANSETRON HCL 4 MG/2ML IJ SOLN: 4.0000 mg | Freq: Four times a day (QID) | INTRAMUSCULAR | Status: DC | PRN

## 2014-11-26 MED ORDER — PHENYLEPHRINE HCL 10 MG/ML IJ SOLN
INTRAMUSCULAR | Status: DC | PRN
  Administered 2014-11-26 (×2): 100 ug via INTRAVENOUS
  Administered 2014-11-26: 200 ug via INTRAVENOUS
  Administered 2014-11-26 (×2): 100 ug via INTRAVENOUS

## 2014-11-26 MED ORDER — WARFARIN SODIUM 2 MG PO TABS
6.0000 mg | ORAL_TABLET | Freq: Once | ORAL | Status: AC
  Administered 2014-11-26: 6 mg via ORAL
  Filled 2014-11-26: qty 3

## 2014-11-26 MED ORDER — MORPHINE SULFATE 2 MG/ML IJ SOLN
2.0000 mg | INTRAMUSCULAR | Status: DC | PRN
  Administered 2014-11-26 – 2014-11-27 (×5): 2 mg via INTRAVENOUS
  Filled 2014-11-26 (×6): qty 1

## 2014-11-26 MED ORDER — MENTHOL 3 MG MT LOZG
1.0000 | LOZENGE | OROMUCOSAL | Status: DC | PRN
  Filled 2014-11-26: qty 9

## 2014-11-26 MED ORDER — DOCUSATE SODIUM 100 MG PO CAPS
100.0000 mg | ORAL_CAPSULE | Freq: Two times a day (BID) | ORAL | Status: DC
  Administered 2014-11-26 – 2014-11-28 (×6): 100 mg via ORAL
  Filled 2014-11-26 (×7): qty 1

## 2014-11-26 MED ORDER — EPHEDRINE SULFATE 50 MG/ML IJ SOLN
INTRAMUSCULAR | Status: DC | PRN
Start: 2014-11-26 — End: 2014-11-26
  Administered 2014-11-26: 10 mg via INTRAVENOUS

## 2014-11-26 MED ORDER — SODIUM CHLORIDE 0.9 % IV SOLN
INTRAVENOUS | Status: DC
  Administered 2014-11-26 – 2014-11-29 (×2): via INTRAVENOUS

## 2014-11-26 MED ORDER — CEFAZOLIN SODIUM-DEXTROSE 2-3 GM-% IV SOLR
INTRAVENOUS | Status: DC | PRN
  Administered 2014-11-26: 2 g via INTRAVENOUS

## 2014-11-26 MED ORDER — PROPOFOL 10 MG/ML IV BOLUS
INTRAVENOUS | Status: DC | PRN
  Administered 2014-11-26: 10 mg via INTRAVENOUS

## 2014-11-26 MED ORDER — ONDANSETRON HCL 4 MG/2ML IJ SOLN
INTRAMUSCULAR | Status: DC | PRN
  Administered 2014-11-26: 4 mg via INTRAVENOUS

## 2014-11-26 MED ORDER — ALUM & MAG HYDROXIDE-SIMETH 200-200-20 MG/5ML PO SUSP: 30.0000 mL | ORAL | Status: DC | PRN

## 2014-11-26 MED ORDER — FENTANYL CITRATE (PF) 100 MCG/2ML IJ SOLN: 25.0000 ug | INTRAMUSCULAR | Status: DC | PRN

## 2014-11-26 MED ORDER — SODIUM CHLORIDE 0.9 % IV SOLN
10000.0000 ug | INTRAVENOUS | Status: DC | PRN
  Administered 2014-11-26: 50 ug/min via INTRAVENOUS

## 2014-11-26 MED ORDER — INSULIN ASPART PROT & ASPART (70-30 MIX) 100 UNIT/ML ~~LOC~~ SUSP
8.0000 [IU] | Freq: Every day | SUBCUTANEOUS | Status: DC
Start: 2014-11-26 — End: 2014-11-29
  Administered 2014-11-26 – 2014-11-28 (×3): 8 [IU] via SUBCUTANEOUS
  Filled 2014-11-26: qty 10
  Filled 2014-11-26: qty 80

## 2014-11-26 MED ORDER — MAGNESIUM CITRATE PO SOLN
1.0000 | Freq: Once | ORAL | Status: AC | PRN
  Filled 2014-11-26: qty 296

## 2014-11-26 MED ORDER — WARFARIN SODIUM 2 MG PO TABS: 6.0000 mg | ORAL_TABLET | Freq: Every day | ORAL | Status: DC

## 2014-11-26 MED ORDER — MAGNESIUM HYDROXIDE 400 MG/5ML PO SUSP: 30.0000 mL | Freq: Every day | ORAL | Status: DC | PRN

## 2014-11-26 SURGICAL SUPPLY — 38 items
BAG COUNTER SPONGE EZ (MISCELLANEOUS) IMPLANT
BIT DRILL AO GAMMA 4.2X180 (BIT) ×3 IMPLANT
BIT DRILL AO GAMMA 4.2X300 (BIT) ×3 IMPLANT
BIT DRILL CANN LG 4.3MM (BIT) ×1 IMPLANT
CANISTER SUCT 1200ML W/VALVE (MISCELLANEOUS) ×3 IMPLANT
COUNTER SPONGE BAG EZ (MISCELLANEOUS)
DRAPE SURG 17X11 SM STRL (DRAPES) ×3 IMPLANT
DRAPE U-SHAPE 47X51 STRL (DRAPES) ×6 IMPLANT
DRILL BIT CANN LG 4.3MM (BIT) ×3
DRSG OPSITE POSTOP 4X12 (GAUZE/BANDAGES/DRESSINGS) ×3 IMPLANT
DURAPREP 26ML APPLICATOR (WOUND CARE) ×3 IMPLANT
GAUZE PETRO XEROFOAM 1X8 (MISCELLANEOUS) ×3 IMPLANT
GAUZE SPONGE 4X4 12PLY STRL (GAUZE/BANDAGES/DRESSINGS) IMPLANT
GLOVE SURG 9.0 ORTHO LTXF (GLOVE) ×3 IMPLANT
GOWN STRL REUS TWL 2XL XL LVL4 (GOWN DISPOSABLE) ×3 IMPLANT
GOWN STRL REUS W/ TWL LRG LVL3 (GOWN DISPOSABLE) ×1 IMPLANT
GOWN STRL REUS W/TWL LRG LVL3 (GOWN DISPOSABLE) ×2
GUIDEPIN VERSANAIL DSP 3.2X444 ×3 IMPLANT
GUIDEROD T2 3X1000 (ROD) ×3 IMPLANT
HEMOVAC 400CC 10FR (MISCELLANEOUS) IMPLANT
K-WIRE  3.2X450M STR (WIRE) ×2
K-WIRE 3.2X450M STR (WIRE) ×1
KIT RM TURNOVER CYSTO AR (KITS) ×3 IMPLANT
KWIRE 3.2X450M STR (WIRE) ×1 IMPLANT
NAIL HIP FRACT 130D 11X180 (Screw) ×3 IMPLANT
NS IRRIG 500ML POUR BTL (IV SOLUTION) ×3 IMPLANT
PACK HIP COMPR (MISCELLANEOUS) ×3 IMPLANT
PAD GROUND ADULT SPLIT (MISCELLANEOUS) ×3 IMPLANT
REAMER ROD DEEP FLUTE 2.5X950 (INSTRUMENTS) ×3 IMPLANT
REAMER SHAFT BIXCUT (INSTRUMENTS) ×3 IMPLANT
SCREW BONE CORTICAL 5.0X38 (Screw) ×3 IMPLANT
SCREW LAG 10.5MMX105MM HFN (Screw) ×3 IMPLANT
STAPLER SKIN PROX 35W (STAPLE) ×3 IMPLANT
SUCTION FRAZIER TIP 10 FR DISP (SUCTIONS) ×3 IMPLANT
SUT VIC AB 2-0 CT1 27 (SUTURE) ×2
SUT VIC AB 2-0 CT1 TAPERPNT 27 (SUTURE) ×1 IMPLANT
SYR 30ML LL (SYRINGE) ×3 IMPLANT
VERSANAIL THREADED GUIDE PIN ×3 IMPLANT

## 2014-11-26 NOTE — Anesthesia Procedure Notes (Signed)
Spinal Patient location during procedure: OR Start time: 11/26/2014 12:31 PM End time: 11/26/2014 12:36 PM Staffing Anesthesiologist: Forestine Chute Performed by: anesthesiologist  Preanesthetic Checklist Completed: patient identified, site marked, surgical consent, pre-op evaluation, timeout performed, IV checked, risks and benefits discussed and monitors and equipment checked Spinal Block Patient position: sitting Prep: ChloraPrep Patient monitoring: heart rate, continuous pulse ox and blood pressure Approach: midline Location: L3-4 Injection technique: single-shot Needle Needle type: Whitacre  Needle gauge: 24 G Needle length: 5 cm Assessment Sensory level: T8

## 2014-11-26 NOTE — Progress Notes (Signed)
Clinical Child psychotherapist (CSW) received SNF consult. Per RN patient is scheduled for surgery today. CSW will assess patient at a later time after surgery. CSW will continue to follow and assist as needed.   Jetta Lout, LCSWA [phone]

## 2014-11-26 NOTE — Anesthesia Preprocedure Evaluation (Signed)
Anesthesia Evaluation  Patient identified by MRN, date of birth, ID band Patient awake and Patient confused    Reviewed: Allergy & Precautions, NPO status , Patient's Chart, lab work & pertinent test results  History of Anesthesia Complications Negative for: history of anesthetic complications  Airway Mallampati: II  TM Distance: >3 FB Neck ROM: Full    Dental no notable dental hx. (+) Upper Dentures, Lower Dentures   Pulmonary neg pulmonary ROS, former smoker,  breath sounds clear to auscultation  Pulmonary exam normal       Cardiovascular hypertension, Pt. on medications +CHF Normal cardiovascular exam+ dysrhythmias Atrial Fibrillation + pacemaker Rhythm:Regular Rate:Normal     Neuro/Psych dementianegative neurological ROS     GI/Hepatic Neg liver ROS, GERD-  Medicated and Controlled,  Endo/Other  diabetes, Well Controlled, Type 2, Insulin DependentHypothyroidism   Renal/GU negative Renal ROS  negative genitourinary   Musculoskeletal negative musculoskeletal ROS (+)   Abdominal   Peds negative pediatric ROS (+)  Hematology negative hematology ROS (+)   Anesthesia Other Findings   Reproductive/Obstetrics negative OB ROS                             Anesthesia Physical Anesthesia Plan  ASA: III  Anesthesia Plan: Spinal   Post-op Pain Management:    Induction:   Airway Management Planned: Simple Face Mask  Additional Equipment:   Intra-op Plan:   Post-operative Plan:   Informed Consent: I have reviewed the patients History and Physical, chart, labs and discussed the procedure including the risks, benefits and alternatives for the proposed anesthesia with the patient or authorized representative who has indicated his/her understanding and acceptance.     Plan Discussed with: CRNA and Surgeon  Anesthesia Plan Comments:         Anesthesia Quick Evaluation

## 2014-11-26 NOTE — Progress Notes (Signed)
Paged Dr. Martha Clan regarding patient's abx cefazolin. Patient has had a severe reaction to penicillins in the past.

## 2014-11-26 NOTE — Care Management Note (Signed)
Case Management Note  Patient Details  Name: Tim Myers MRN: [id number] Date of Birth: 07/04/1924  Subjective/Objective:                   Patient lying in bed asking for pain medicine related to hip. His wife has notified RN and sitting at patient's side. He has two adult sons also at bedside and one grandchild present. He is from home with his wife. His wife works at [employer] but patient is not a [facility] patient. He has no DME at home because he was independent. His PCP is Dr. Dareen Piano. He uses [facility]. On [employer]. Surgery pending.  Action/Plan: RNCM will continue to follow.  List of home health agencies and SNF beds delivered to patients room.   Expected Discharge Date:  11/28/14               Expected Discharge Plan:     In-House Referral:  Clinical Social Work  Discharge planning Services  CM Consult  Post Acute Care Choice:    Choice offered to:  Patient, Spouse, Adult Children  DME Arranged:    DME Agency:     HH Arranged:    HH Agency:     Status of Service:     Medicare Important Message Given:  Yes Date Medicare IM Given:  11/26/14 Medicare IM give by:  Collie Siad Date Additional Medicare IM Given:    Additional Medicare Important Message give by:     If discussed at Long Length of Stay Meetings, dates discussed:    Additional Comments:  Collie Siad, RN 11/26/2014, 8:56 AM

## 2014-11-26 NOTE — Progress Notes (Signed)
ANTICOAGULATION CONSULT NOTE - Initial Consult  Pharmacy Consult for Warfarin Dosing Indication: atrial fibrillation  Allergies  Allergen Reactions  . Erythromycin Other (See Comments)    Reaction:  Unknown   . Penicillins Swelling    Patient Measurements: Height: 5\' 9"  (175.3 cm) Weight: 162 lb 8 oz (73.71 kg) IBW/kg (Calculated) : 70.7   Vital Signs: Temp: 97.7 F (36.5 C) (06/10 1132) Temp Source: Oral (06/10 1132) BP: 105/64 mmHg (06/10 1132) Pulse Rate: 78 (06/10 1333)  Labs:  Recent Labs  11/25/14 1354 11/26/14 0428 11/26/14 1058  HGB 11.6* 10.0*  --   HCT 35.0* 30.3*  --   PLT 147* 111*  --   LABPROT 23.8* 18.2* 16.7*  INR 2.11 1.49 1.33  CREATININE 1.81* 1.78*  --     Estimated Creatinine Clearance: 28.7 mL/min (by C-G formula based on Cr of 1.78).   Medical History: Past Medical History  Diagnosis Date  . Diabetes mellitus without complication   . Hypertension   . Dementia   . A-fib   . Chronic systolic CHF (congestive heart failure)   . WPW (Wolff-Parkinson-White syndrome)   . PAD (peripheral artery disease)   . Hypothyroidism   . CAD (coronary artery disease)   . Alzheimer disease   . Depression   . Hyperlipidemia     Medications:  Scheduled:  . cyanocobalamin  1,000 mcg Intramuscular Q30 days  . docusate sodium  300 mg Oral QHS  . donepezil  10 mg Oral q morning - 10a  . ferrous fumarate  1 tablet Oral See admin instructions  . insulin aspart  0-9 Units Subcutaneous TID WC  . insulin aspart protamine- aspart  15 Units Subcutaneous Q breakfast  . insulin aspart protamine- aspart  8 Units Subcutaneous Q supper  . levothyroxine  112 mcg Oral q morning - 10a  . memantine  10 mg Oral BID  . pantoprazole  40 mg Oral Daily  . sertraline  25 mg Oral q morning - 10a  . sevelamer carbonate  800 mg Oral BID WC  . simvastatin  20 mg Oral QHS  . torsemide  40 mg Oral q morning - 10a  . warfarin  6 mg Oral Once  . [START ON 11/27/2014]  warfarin  6 mg Oral Daily  . Warfarin - Pharmacist Dosing Inpatient   Does not apply q1800    Assessment: Patient is an 50 you male admitted for hip fracture.  Patient on chronic warfarin therapy for atrial fibrillation.  Home dosing for warfarin of 6 mg po daily.  INR on admission of 2.11.  Patient received Vitamin K 10 mg IV x 2.  INR this morning of 1.49.  Hgb: 10.0  Goal of Therapy:  INR 2-3 Monitor hemoglobin per policy   Plan:  MD gave verbal order to restart warfarin today after surgery.  Will order patient's home dose of 6 mg po to start this evening at 2000.  Will recheck INR in AM.  Clarisa Schools, PharmD Clinical Pharmacist 11/26/2014

## 2014-11-26 NOTE — Progress Notes (Signed)
[facility] [place] [facility]   PATIENT NAME: Tim Myers    MR#:  [id number]  DATE OF BIRTH:  07/04/1924  SUBJECTIVE:  CHIEF COMPLAINT:   Chief Complaint  Patient presents with  . Fall    REVIEW OF SYSTEMS:    Review of Systems  Constitutional: Positive for malaise/fatigue. Negative for fever and chills.  HENT: Negative for sore throat.   Eyes: Negative for blurred vision, double vision and pain.  Respiratory: Negative for cough, hemoptysis, shortness of breath and wheezing.   Cardiovascular: Negative for chest pain, palpitations, orthopnea and leg swelling.  Gastrointestinal: Positive for nausea. Negative for heartburn, vomiting, abdominal pain, diarrhea and constipation.  Genitourinary: Negative for dysuria and hematuria.  Musculoskeletal: Positive for back pain and joint pain.  Skin: Negative for rash.  Neurological: Positive for weakness. Negative for sensory change, speech change, focal weakness and headaches.  Endo/Heme/Allergies: Does not bruise/bleed easily.  Psychiatric/Behavioral: Positive for memory loss. Negative for depression. The patient is not nervous/anxious.       DRUG ALLERGIES:   Allergies  Allergen Reactions  . Erythromycin Other (See Comments)    Reaction:  Unknown   . Penicillins Swelling    VITALS:  Blood pressure 105/64, pulse 74, temperature 97.7 F (36.5 C), temperature source Oral, resp. rate 18, height  (1.753 m), weight 73.71 kg (162 lb 8 oz), SpO2 99 %.  PHYSICAL EXAMINATION:   Physical Exam  GENERAL:  81 y.o.-year-old patient lying in the bed with no acute distress.  EYES: Pupils equal, round, reactive to light and accommodation. No scleral icterus. Extraocular muscles intact.  HEENT: Head atraumatic, normocephalic. Oropharynx and nasopharynx clear.  NECK:  Supple, no jugular venous distention. No thyroid enlargement, no tenderness.  LUNGS: Normal breath sounds bilaterally, no wheezing, rales,  rhonchi. No use of accessory muscles of respiration.  CARDIOVASCULAR: S1, S2 normal. No murmurs, rubs, or gallops.  ABDOMEN: Soft, nontender, nondistended. Bowel sounds present. No organomegaly or mass.  EXTREMITIES: No cyanosis, clubbing or edema b/l.   Tender right hip NEUROLOGIC: Cranial nerves II through XII are intact. No focal Motor or sensory deficits b/l.   PSYCHIATRIC: The patient is alert and oriented x 3.  SKIN: No obvious rash, lesion, or ulcer.    LABORATORY PANEL:   CBC  Recent Labs Lab 11/26/14 0428  WBC 6.6  HGB 10.0*  HCT 30.3*  PLT 111*   ------------------------------------------------------------------------------------------------------------------  Chemistries   Recent Labs Lab 11/26/14 0428  NA 139  K 3.9  CL 103  CO2 29  GLUCOSE 143*  BUN 44*  CREATININE 1.78*  CALCIUM 8.5*   ------------------------------------------------------------------------------------------------------------------  Cardiac Enzymes No results for input(s): TROPONINI in the last 168 hours. ------------------------------------------------------------------------------------------------------------------  RADIOLOGY:  Dg Chest Port 1 View  11/25/2014   CLINICAL DATA:  Pre operative respiratory exam.  Right hip fracture.  EXAM: PORTABLE CHEST - 1 VIEW  COMPARISON:  06/22/2011  FINDINGS: Heart size and pulmonary vascularity are normal. Tortuosity and calcification of the thoracic aorta. Slight no infiltrates or effusions. CABG. Pacemaker and AICD. Osteopenia. No acute osseous abnormality. Nodular density in the left midzone is unchanged and probably represents an end on blood vessel.  IMPRESSION: No acute abnormality.   Electronically Signed   By: Francene Boyers M.D.   On: 11/25/2014 14:38   Dg Hip Unilat With Pelvis 2-3 Views Right  11/25/2014   CLINICAL DATA:  Patient found down. RIGHT hip pain. Initial encounter.  EXAM: RIGHT HIP (WITH PELVIS) 2-3  VIEWS  COMPARISON:  None.   FINDINGS: Intertrochanteric RIGHT femur fracture is present. Diffuse osteopenia. The RIGHT femoral head remains located. Aortoiliac stent graft is present with embolization coils in the RIGHT anatomic pelvis. Severe atherosclerosis.  The obturator rings appear intact.  IMPRESSION: Intertrochanteric RIGHT femur fracture.   Electronically Signed   By: Andreas Newport M.D.   On: 11/25/2014 14:30     ASSESSMENT AND PLAN:   # Right intertrochanteric fracture from fall Surgery later today. Watch for any complications. Pain management. PT consult. SNF at discharge.  * Acute blood loss anemia from fracture Monitor. Transfuse PRN  * Persistent Afib On coumadin. Vitamin K given prior to surgery. Restart coumadin today evening  # chronic systolic congestive heart failure - EF 25% Continue torsemide. No IVF after surgery.  # history of diabetes mellitus Continue insulin human 70/30 15 units a.m. and 10 units at dinnertime Continue sliding scale insulin  # history of coronary artery disease status post CABG  # history of dementia-continue Namenda  # history of hypertension  # Dementia Monitor for any inpatient delirium     All the records are reviewed and case discussed with Care Management/Social Workerr. Management plans discussed with the patient, family and they are in agreement.  CODE STATUS: FULL CODE  DVT Prophylaxis: SCDs  TOTAL TIME TAKING CARE OF THIS PATIENT: 35 minutes.   POSSIBLE D/C IN 3-4 DAYS, DEPENDING ON CLINICAL CONDITION.   Milagros Loll R M.D on 11/26/2014 at 1:24 PM  Between 7am to 6pm - Pager - [phone]  After 6pm go to [email or web address] - password [email or web address] [facility]  [facility]  Office  [phone]  CC: Primary care physician; Lauro Regulus., MD

## 2014-11-26 NOTE — Op Note (Signed)
DATE OF SURGERY:  11/26/2014  TIME: 2:22 PM  PATIENT NAME:  Tim Myers  AGE: 81 y.o.  PRE-OPERATIVE DIAGNOSIS:  right intertrochanteric fracture  POST-OPERATIVE DIAGNOSIS:  SAME  PROCEDURE:  INTRAMEDULLARY (IM) NAIL FEMORAL  SURGEON:  Thornton Park  OPERATIVE IMPLANTS: Biomet short Affixus nail 11 x 180 mm, 105 mm lag screw with a 38 mm distal interlocking screw  PREOPERATIVE INDICATIONS:  Tim Myers is a 81 y.o. year old who fell and suffered a hip fracture. He was brought into the ER and then admitted to the hospitalist service and medically cleared for surgical intervention.  I met with the patient and the family last night to explain to them about the patient's fracture. I recommended intramedullary fixation for this fracture.  The risks, benefits and alternatives were discussed with the patient and their family.  The risks include but are not limited to infection, bleeding, nerve or blood vessel injury, malunion, nonunion, hardware prominence, hardware failure, change in leg lengths or lower extremity rotation need for further surgery including hardware removal with conversion to a total hip arthroplasty. Medical risks include but are not limited to DVT and pulmonary embolism, myocardial infarction, stroke, pneumonia, respiratory failure and death. The patient and their family understood these risks and wished to proceed with surgery.  OPERATIVE PROCEDURE:  The patient was brought to the operating room and placed in the supine position on the fracture table.Marland Kitchen A spinal block was placed by the anesthesia service.  A closed reduction of the right hip fracture was performed under C-arm guidance.  The fracture reduction was confirmed on both AP and lateral views.  A time out was performed to verify the patient's name, date of birth, medical record number, correct site of surgery correct procedure to be performed. The timeout was also used to verify the patient received antibiotics and  all appropriate instruments, implants and radiographic studies were available in the room. Once all in attendance were in agreement, the case began. The patient was prepped and draped in a sterile fashion. He received preoperative antibiotics.  An incision was made proximal to the greater trochanter in line with the femur. A guidewire was placed over the tip of the greater trochanter and advanced into the proximal femur to the level of the lesser trochanter.  Confirmation of the drill pin position was made on AP and lateral C-arm images.  The threaded guidepin was then overdrilled with the proximal femoral drill.  The nail was then inserted into the proximal femur, across the fracture site and into the femoral shaft. Its position was confirmed on AP and lateral C-arm images.   Once the nail was completely seated, the drill guide for the lag screw was placed through the guide arm for the Affixus nail. A guidepin was then placed through this drill guide and advanced through the lateral cortex of the femur, across the fracture site and into the femoral head achieving a tip apex distance of less than 25 mm. The length of the drill pin was measured, and then the drill for the lag screw was advanced through the lateral cortex, across the fracture site and up into the femoral head to the depth of the lag screw..  The lag screw was then advanced by hand into position across the fracture site into the femoral head. Its final position was confirmed on AP and lateral C-arm images. Compression was applied as traction was carefully released. The set screw in the top of the intramedullary rod was  tightened by hand using a screwdriver. It was backed off a quarter turn to allow for compression at the fracture site.  The drill sleeve for the distal interlocking screw was then placed through the Affixus guide arm. A small stab incision was made to allow the drill guide to approximate the lateral cortex of the femur. The drill  for the distal interlocking screw was then advanced bicortically. The depth of this drill was measured. A distal interlocking screw with the length measured was then inserted by hand through the guide arm. Final C-arm images of the entire intramedullary construct were taken in both the AP and lateral planes.   The wounds were irrigated copiously and closed with 0 Vicryl for closure of the deep fashion and 2-0 Vicryl for his subcutaneous closure. The skin was approximated with staples. A dry sterile dressing was applied. I was scrubbed and present the entire case and all sharp and instrument counts were correct at the conclusion of the case. Patient was transferred to hospital bed and brought to PACU in stable condition. I spoke with the patient's family in the postop consultation room to let them know the case had gone without complication and the patient was stable in the recovery room.  He will be partial weightbearing and begin physical therapy tomorrow. The patient will restart his Coumadin tonight.   Timoteo Gaul, MD

## 2014-11-26 NOTE — OR Nursing (Signed)
Date of birth noticed to be incorrect during pre operative assessment. Notified charge nurse Autumn Messing. Pt and family stated year of birth to be [birth year].

## 2014-11-26 NOTE — Progress Notes (Signed)
Dr. Martha Clan returned call regarding abx. Patient had the abx in the OR with no problems.   Trying to get patient's pain under control. Morphine 2mg  given with little relief; an hour later I gave 5mg  oxy. Will continue to monitor.

## 2014-11-26 NOTE — Progress Notes (Signed)
Initial Nutrition Assessment  DOCUMENTATION CODES:     INTERVENTION:   (None: Goal for diet advancement after surgery)  NUTRITION DIAGNOSIS:  Inadequate oral intake related to acute illness as evidenced by NPO status.  GOAL:  Patient will meet greater than or equal to 90% of their needs once diet advanced  MONITOR:  Diet advancement  REASON FOR ASSESSMENT:  Malnutrition Screening Tool    ASSESSMENT:  Reason For Admission: Hip fracture  PMHx:  Past Medical History  Diagnosis Date  . Diabetes mellitus without complication   . Hypertension   . Dementia     Typical Fluid/ Food Intake: currently NPO for sx Meal/ Snack Patterns: Patient and Wife at bedside report a good appetite and intake of a regular diet PTA. Per patient, he was "eating everything I could get my hands on".  Supplements: None  Labs:  Electrolyte and Renal Profile:  Recent Labs Lab 11/25/14 1354 11/26/14 0428  BUN 42* 44*  CREATININE 1.81* 1.78*  NA 140 139  K 3.9 3.9   Meds: b-12, colace, novolog, Vit K  Physical Findings: n/a Weight Changes: Wife reports that patient's UBW is around 170#, last known 6 mos to 1 year. Current body weight has been stable x 2 months.    Height:  Ht Readings from Last 1 Encounters:  11/25/14 5\' 9"  (1.753 m)    Weight:  Wt Readings from Last 1 Encounters:  11/25/14 162 lb 8 oz (73.71 kg)    Ideal Body Weight:     Wt Readings from Last 10 Encounters:  11/25/14 162 lb 8 oz (73.71 kg)    BMI:  Body mass index is 23.99 kg/(m^2).  Skin:  Reviewed, no issues  Diet Order:  Diet NPO time specified  EDUCATION NEEDS:  No education needs identified at this time   Intake/Output Summary (Last 24 hours) at 11/26/14 1207 Last data filed at 11/26/14 1146  Gross per 24 hour  Intake      0 ml  Output   1500 ml  Net  -1500 ml    Last BM:  n/a  Joeseph Amor, RDN Pager: [phone] Office: [phone]  LOW Care Level

## 2014-11-26 NOTE — Transfer of Care (Signed)
Immediate Anesthesia Transfer of Care Note  Patient: Tim Myers  Procedure(s) Performed: Procedure(s) with comments: INTRAMEDULLARY (IM) NAIL FEMORAL (Right) - femur  Patient Location: PACU  Anesthesia Type:Spinal  Level of Consciousness: awake, alert , oriented and patient cooperative  Airway & Oxygen Therapy: Patient Spontanous Breathing and Patient connected to face mask oxygen  Post-op Assessment: Report given to RN, Post -op Vital signs reviewed and stable and Patient moving all extremities X 4  Post vital signs: Reviewed and stable  Last Vitals:  Filed Vitals:   11/26/14 1406  BP: 99/51  Pulse: 77  Temp: 37.2 C  Resp: 14    Complications: No apparent anesthesia complications

## 2014-11-27 LAB — CBC
HEMATOCRIT: 28.1 % — AB (ref 40.0–52.0)
Hemoglobin: 9.3 g/dL — ABNORMAL LOW (ref 13.0–18.0)
MCH: 29.4 pg (ref 26.0–34.0)
MCHC: 33.2 g/dL (ref 32.0–36.0)
MCV: 88.7 fL (ref 80.0–100.0)
Platelets: 92 10*3/uL — ABNORMAL LOW (ref 150–440)
RBC: 3.16 MIL/uL — AB (ref 4.40–5.90)
RDW: 16.2 % — ABNORMAL HIGH (ref 11.5–14.5)
WBC: 6.9 10*3/uL (ref 3.8–10.6)

## 2014-11-27 LAB — BASIC METABOLIC PANEL
Anion gap: 9 (ref 5–15)
BUN: 36 mg/dL — ABNORMAL HIGH (ref 6–20)
CO2: 27 mmol/L (ref 22–32)
CREATININE: 1.58 mg/dL — AB (ref 0.61–1.24)
Calcium: 8.3 mg/dL — ABNORMAL LOW (ref 8.9–10.3)
Chloride: 105 mmol/L (ref 101–111)
GFR, EST AFRICAN AMERICAN: 43 mL/min — AB (ref >60)
GFR, EST NON AFRICAN AMERICAN: 37 mL/min — AB (ref >60)
Glucose, Bld: 149 mg/dL — ABNORMAL HIGH (ref 65–99)
Potassium: 4.3 mmol/L (ref 3.5–5.1)
Sodium: 141 mmol/L (ref 135–145)

## 2014-11-27 LAB — PROTIME-INR
INR: 1.2
Prothrombin Time: 15.4 seconds — ABNORMAL HIGH (ref 11.4–15.0)

## 2014-11-27 LAB — GLUCOSE, CAPILLARY
Glucose-Capillary: 135 mg/dL — ABNORMAL HIGH (ref 65–99)
Glucose-Capillary: 139 mg/dL — ABNORMAL HIGH (ref 65–99)
Glucose-Capillary: 86 mg/dL (ref 65–99)
Glucose-Capillary: 96 mg/dL (ref 65–99)

## 2014-11-27 MED ORDER — WARFARIN - PHARMACIST DOSING INPATIENT
Freq: Every day | Status: DC
  Administered 2014-11-28: 17:00:00

## 2014-11-27 MED ORDER — AMLODIPINE BESYLATE 5 MG PO TABS
5.0000 mg | ORAL_TABLET | Freq: Every day | ORAL | Status: DC
  Administered 2014-11-29: 5 mg via ORAL
  Filled 2014-11-27 (×3): qty 1

## 2014-11-27 MED ORDER — WARFARIN SODIUM 2 MG PO TABS
6.0000 mg | ORAL_TABLET | Freq: Every day | ORAL | Status: AC
  Administered 2014-11-27: 6 mg via ORAL
  Filled 2014-11-27: qty 3

## 2014-11-27 NOTE — Progress Notes (Signed)
[facility] - [place] at [facility]   PATIENT NAME: Tim Myers    MR#:  [id number]  DATE OF BIRTH:  07/04/1924  SUBJECTIVE:  CHIEF COMPLAINT:   Chief Complaint  Patient presents with  . Fall   S/p sx 11/26/14.  REVIEW OF SYSTEMS:    Review of Systems  Constitutional: Positive for malaise/fatigue. Negative for fever and chills.  HENT: Negative for sore throat.   Eyes: Negative for blurred vision, double vision and pain.  Respiratory: Negative for cough, hemoptysis, shortness of breath and wheezing.   Cardiovascular: Negative for chest pain, palpitations, orthopnea and leg swelling.  Gastrointestinal: Negative for heartburn, nausea, vomiting, abdominal pain, diarrhea and constipation.  Genitourinary: Negative for dysuria and hematuria.  Musculoskeletal: Positive for joint pain. Negative for back pain.  Skin: Negative for rash.  Neurological: Positive for weakness. Negative for sensory change, speech change, focal weakness and headaches.  Endo/Heme/Allergies: Does not bruise/bleed easily.  Psychiatric/Behavioral: Positive for memory loss. Negative for depression. The patient is not nervous/anxious.     DRUG ALLERGIES:   Allergies  Allergen Reactions  . Ambien [Zolpidem Tartrate]   . Erythromycin Other (See Comments)    Reaction:  Unknown   . Penicillins Swelling    VITALS:  Blood pressure 123/49, pulse 76, temperature 98.1 F (36.7 C), temperature source Oral, resp. rate 18, height  (1.753 m), weight 73.71 kg (162 lb 8 oz), SpO2 97 %.  PHYSICAL EXAMINATION:   Physical Exam  GENERAL:  81 y.o.-year-old patient lying in the bed with no acute distress.  EYES: Pupils equal, round, reactive to light and accommodation. No scleral icterus. Extraocular muscles intact.  HEENT: Head atraumatic, normocephalic. Oropharynx and nasopharynx clear.  NECK:  Supple, no jugular venous distention. No thyroid enlargement, no tenderness.  LUNGS: Normal breath  sounds bilaterally, no wheezing, rales, rhonchi. No use of accessory muscles of respiration.  CARDIOVASCULAR: S1, S2 normal. No murmurs, rubs, or gallops.  ABDOMEN: Soft, nontender, nondistended. Bowel sounds present. No organomegaly or mass.  EXTREMITIES: No cyanosis, clubbing or edema b/l.   Tender right hip NEUROLOGIC: Cranial nerves II through XII are intact. No focal Motor or sensory deficits b/l.   PSYCHIATRIC: The patient is alert and oriented x 3.  SKIN: No obvious rash, lesion, or ulcer.    LABORATORY PANEL:   CBC  Recent Labs Lab 11/27/14 0418  WBC 6.9  HGB 9.3*  HCT 28.1*  PLT 92*   ------------------------------------------------------------------------------------------------------------------  Chemistries   Recent Labs Lab 11/27/14 0418  NA 141  K 4.3  CL 105  CO2 27  GLUCOSE 149*  BUN 36*  CREATININE 1.58*  CALCIUM 8.3*   ------------------------------------------------------------------------------------------------------------------  Cardiac Enzymes No results for input(s): TROPONINI in the last 168 hours. ------------------------------------------------------------------------------------------------------------------  RADIOLOGY:  Dg Chest Port 1 View  11/25/2014   CLINICAL DATA:  Pre operative respiratory exam.  Right hip fracture.  EXAM: PORTABLE CHEST - 1 VIEW  COMPARISON:  06/22/2011  FINDINGS: Heart size and pulmonary vascularity are normal. Tortuosity and calcification of the thoracic aorta. Slight no infiltrates or effusions. CABG. Pacemaker and AICD. Osteopenia. No acute osseous abnormality. Nodular density in the left midzone is unchanged and probably represents an end on blood vessel.  IMPRESSION: No acute abnormality.   Electronically Signed   By: Francene Boyers M.D.   On: 11/25/2014 14:38   Dg C-arm 61-120 Min  11/26/2014   CLINICAL DATA:  ORIF of proximal right femoral fracture  EXAM: DG C-ARM 61-120 MIN; RIGHT  FEMUR PORTABLE 1 VIEW   COMPARISON:  11/25/2014  FLUOROSCOPY TIME:  Radiation Exposure Index (as provided by the fluoroscopic device): 9.75 mGy  If the device does not provide the exposure index:  Fluoroscopy Time:  1 minutes 13 seconds  Number of Acquired Images:  6  FINDINGS: Proximal medullary rod and compression screw are now seen in the proximal right femur. The fracture fragments are in near anatomic alignment. No other focal abnormality is seen.  IMPRESSION: Status post ORIF of proximal right femoral fracture   Electronically Signed   By: Alcide Clever M.D.   On: 11/26/2014 14:01   Dg Hip Port Unilat With Pelvis 1v Right  11/26/2014   CLINICAL DATA:  Right hip fracture.  EXAM: RIGHT HIP (WITH PELVIS) 1 VIEW PORTABLE  COMPARISON:  11/25/2014  FINDINGS: The patient has undergone open reduction and internal fixation of the intertrochanteric fracture of the proximal right femur. Alignment and position of the fracture fragments is near anatomic. Intramedullary nail and screw have been inserted and appear in good position. Visualized pelvic bones are intact.  IMPRESSION: Satisfactory appearance of the right hip after open reduction and internal fixation of intertrochanteric fracture.   Electronically Signed   By: Francene Boyers M.D.   On: 11/26/2014 14:45   Dg Hip Unilat With Pelvis 2-3 Views Right  11/25/2014   CLINICAL DATA:  Patient found down. RIGHT hip pain. Initial encounter.  EXAM: RIGHT HIP (WITH PELVIS) 2-3 VIEWS  COMPARISON:  None.  FINDINGS: Intertrochanteric RIGHT femur fracture is present. Diffuse osteopenia. The RIGHT femoral head remains located. Aortoiliac stent graft is present with embolization coils in the RIGHT anatomic pelvis. Severe atherosclerosis.  The obturator rings appear intact.  IMPRESSION: Intertrochanteric RIGHT femur fracture.   Electronically Signed   By: Andreas Newport M.D.   On: 11/25/2014 14:30   Dg Femur Port, Min 2 Views Right  11/26/2014   CLINICAL DATA:  ORIF of proximal right femoral  fracture  EXAM: DG C-ARM 61-120 MIN; RIGHT FEMUR PORTABLE 1 VIEW  COMPARISON:  11/25/2014  FLUOROSCOPY TIME:  Radiation Exposure Index (as provided by the fluoroscopic device): 9.75 mGy  If the device does not provide the exposure index:  Fluoroscopy Time:  1 minutes 13 seconds  Number of Acquired Images:  6  FINDINGS: Proximal medullary rod and compression screw are now seen in the proximal right femur. The fracture fragments are in near anatomic alignment. No other focal abnormality is seen.  IMPRESSION: Status post ORIF of proximal right femoral fracture   Electronically Signed   By: Alcide Clever M.D.   On: 11/26/2014 14:01     ASSESSMENT AND PLAN:   * Right intertrochanteric fracture from fall Surgery on 11/26/14 Pain management. PT consult. Likely need SNF at discharge.  * Acute blood loss anemia from fracture  Monitor. Transfuse PRN  * Persistent Afib On coumadin. Vitamin K given prior to surgery. Restart coumadin.  Unexplained fall- will get cardiology to help with defibrillator.  * chronic systolic congestive heart failure - EF 25% Continue torsemide. No IVF after surgery.  * history of diabetes mellitus Continue insulin human 70/30 15 units a.m. and 10 units at dinnertime Continue sliding scale insulin  * history of coronary artery disease status post CABG  * history of dementia-continue Namenda  * history of hypertension- add amlodipin.  # Dementia Monitor for any inpatient delirium     All the records are reviewed and case discussed with Care Management/Social Workerr. Management plans discussed with  the patient, family and they are in agreement.  CODE STATUS: FULL CODE  DVT Prophylaxis: SCDs  TOTAL TIME TAKING CARE OF THIS PATIENT: 35 minutes.   POSSIBLE D/C IN 2-3 DAYS, DEPENDING ON CLINICAL CONDITION.   Altamese Dilling M.D on 11/27/2014 at 2:17 PM  Between 7am to 6pm - Pager - [phone]  After 6pm go to [email or web address] - password [email or web address]  [facility]  [facility]  Office  [phone]  CC: Primary care physician; Lauro Regulus., MD

## 2014-11-27 NOTE — Progress Notes (Signed)
Dr. Greggory Brandy paged due to no output.  Bladder scan results of .  No new orders, just wants to watch for output.

## 2014-11-27 NOTE — Progress Notes (Signed)
Physical Therapy Treatment Patient Details Name: Tim Myers MRN: [id number] DOB: 07/04/1924 Today's Date: 11/27/2014    History of Present Illness Pt with fall and R hip fx, ORIF    PT Comments    Pt shows poor awareness, is very pain limited and ultimately needs a lot of assist and cuing to perform most acts.  He shows decent effort when he is able to focus on exercises, but struggles secondary to pain and mental status.    Follow Up Recommendations  SNF     Equipment Recommendations  Rolling walker with 5" wheels    Recommendations for Other Services       Precautions / Restrictions Precautions Precautions: Fall Restrictions Weight Bearing Restrictions: Yes RLE Weight Bearing: Partial weight bearing    Mobility  Bed Mobility Overal bed mobility: +2 for physical assistance;Needs Assistance Bed Mobility: Sit to Supine     Supine to sit: Max assist;Total assist     General bed mobility comments: Pt pain limited and unable to really assist much  Transfers Overall transfer level: Needs assistance   Transfers: Stand Pivot Transfers   Stand pivot transfers: Total assist;+2 physical assistance       General transfer comment: Pt shows very little effort with transfers, needing heavy cuing and assist.    Ambulation/Gait Ambulation/Gait assistance:  (unable)               Stairs            Wheelchair Mobility    Modified Rankin (Stroke Patients Only)       Balance                                    Cognition Arousal/Alertness: Awake/alert Behavior During Therapy: Anxious;Restless;Agitated Overall Cognitive Status: History of cognitive impairments - at baseline                      Exercises General Exercises - Lower Extremity Ankle Circles/Pumps: 10 reps;AAROM Quad Sets: AAROM;5 reps Heel Slides: PROM;10 reps Hip ABduction/ADduction: AAROM;PROM;5 reps    General Comments        Pertinent Vitals/Pain Pain  Assessment:  (does not rate, indicates severe pain the entire time)    Home Living                      Prior Function            PT Goals (current goals can now be found in the care plan section) Acute Rehab PT Goals PT Goal Formulation: With patient/family Time For Goal Achievement: 12/11/14 Potential to Achieve Goals: Fair    Frequency  BID    PT Plan      Co-evaluation             End of Session           Time:  -     Charges:  $Therapeutic Exercise: 23-37 mins                    G Codes:     Loran Senters, PT, DPT [phone]  Malachi Pro 11/27/2014, 4:02 PM

## 2014-11-27 NOTE — Anesthesia Post-op Follow-up Note (Signed)
  Anesthesia Pain Follow-up Note  Patient: Tim Myers  Day #: 1  Date of Follow-up: 11/27/2014 Time: 4:55 PM  Last Vitals:  Filed Vitals:   11/27/14 1554  BP: 111/53  Pulse: 75  Temp: 36.8 C  Resp: 16    Level of Consciousness: alert  Pain: none   Side Effects:None  Catheter Site Exam:clean, dry, no drainage  Plan: D/C from anesthesia care  Tim Myers

## 2014-11-27 NOTE — Anesthesia Postprocedure Evaluation (Signed)
  Anesthesia Post-op Note  Patient: Tim Myers  Procedure(s) Performed: Procedure(s) with comments: INTRAMEDULLARY (IM) NAIL FEMORAL (Right) - femur  Anesthesia type:Spinal  Patient location: PACU  Post pain: Pain level controlled  Post assessment: Post-op Vital signs reviewed, Patient's Cardiovascular Status Stable, Respiratory Function Stable, Patent Airway and No signs of Nausea or vomiting  Post vital signs: Reviewed and stable  Last Vitals:  Filed Vitals:   11/27/14 1554  BP: 111/53  Pulse: 75  Temp: 36.8 C  Resp: 16    Level of consciousness: awake, alert  and patient cooperative  Complications: No apparent anesthesia complications

## 2014-11-27 NOTE — Evaluation (Signed)
Occupational Therapy Evaluation Patient Details Name: Tim Myers MRN: [id number] DOB: 07/04/1924 Today's Date: 11/27/2014    History of Present Illness Right hip fx with ORIF   Clinical Impression   Patient is an 81 year old male that sustained an unwitnessed fall in his living room. He was found by his son and admitted for ORIF for surgical repair. Patient is PWB on RLE at this time. When OT arrived patient was just completing transfer from bed to recliner with PT. Patient required significant assist. Please refer to PT evaluation for details. Patient seen by OT from recliner level. Per wife patient was able to ambulate without AD in home and community prior to fall. Wife also states patient was able to perform all self-care ADL tasks independently prior to admission. Patient was initially alert, but became more lethargic as session continued with increased attempts to maintain attention to task. LUE ROM is limited to 90 degrees shoulder flexion. Wife states this is old injury. Patient was oriented to person and reason for admission only. Was aware he was in a hospital, but unable to name. Could not identify date or year. Patient unable to reach to perform LB dressing from recliner level. Educated in use of AE for LB dressing with MAX A and verbal cues/demonstration.    Follow Up Recommendations  SNF    Equipment Recommendations       Recommendations for Other Services       Precautions / Restrictions Precautions Precautions: Fall Restrictions Weight Bearing Restrictions: Yes RLE Weight Bearing: Partial weight bearing      Mobility Bed Mobility Overal bed mobility: +2 for physical assistance;Needs Assistance Bed Mobility: Supine to Sit     Supine to sit: Max assist     General bed mobility comments: Pt pain limited and unable to really assist much  Transfers Overall transfer level: Needs assistance Equipment used: Rolling walker (2 wheeled) Transfers: Sit to/from  Stand Sit to Stand: Total assist;+2 physical assistance         General transfer comment: PT was completing transfer bed to recliner when OT arrived. Refer to PT evaluation for further details.    Balance Overall balance assessment: History of Falls                                          ADL Overall ADL's : Needs assistance/impaired                     Lower Body Dressing: Maximal assistance;With adaptive equipment;Cueing for sequencing;Cueing for compensatory techniques                       Vision     Perception     Praxis      Pertinent Vitals/Pain Pain Assessment: 0-10 Pain Score: 8  Pain Location: right hip during stand for transfer, states no pain when sitting at rest Pain Intervention(s): Limited activity within patient's tolerance;Monitored during session     Hand Dominance Right   Extremity/Trunk Assessment Upper Extremity Assessment Upper Extremity Assessment: LUE deficits/detail LUE Deficits / Details: 90 shoulder flexion/abduction. History of old injury per wife.    Lower Extremity Assessment Lower Extremity Assessment: Defer to PT evaluation RLE Deficits / Details: essentially no AROM, able to do some movement with AAROM       Communication Communication Communication: No difficulties   Cognition  Arousal/Alertness: Lethargic;Awake/alert (alert initially (at end of PT session), became more lethargic by end of OT session.) Behavior During Therapy: [facility] for tasks assessed/performed Overall Cognitive Status: History of cognitive impairments - at baseline       Memory: Decreased short-term memory;Decreased recall of precautions             General Comments       Exercises Exercises: General Lower Extremity     Shoulder Instructions      Home Living Family/patient expects to be discharged to:: Private residence Living Arrangements: Spouse/significant other   Type of Home: House Home Access: Stairs to  enter Secretary/administrator of Steps: 1 Entrance Stairs-Rails: Left Home Layout: One level     Bathroom Shower/Tub: Chief Strategy Officer: Standard     Home Equipment: None          Prior Functioning/Environment Level of Independence: Independent        Comments: wife provided history. States was Independent with ambulation and all self-care tasks    OT Diagnosis: Generalized weakness;Cognitive deficits;Acute pain   OT Problem List: Decreased knowledge of use of DME or AE;Decreased safety awareness;Decreased knowledge of precautions;Pain;Decreased activity tolerance;Impaired balance (sitting and/or standing);Decreased cognition   OT Treatment/Interventions: Self-care/ADL training;DME and/or AE instruction;Patient/family education    OT Goals(Current goals can be found in the care plan section) Acute Rehab OT Goals Patient Stated Goal: "I want him to come back home" OT Goal Formulation: With patient/family Time For Goal Achievement: 12/11/14 Potential to Achieve Goals: Fair  OT Frequency: Min 1X/week   Barriers to D/C:            Co-evaluation              End of Session Nurse Communication: Patient requests pain meds  Activity Tolerance: Patient limited by lethargy Patient left: in chair;with call bell/phone within reach;with chair alarm set;with family/visitor present;with nursing/sitter in room   Time: 0900-0930 OT Time Calculation (min): 30 min Charges:  OT General Charges $OT Visit: 1 Procedure OT Evaluation $Initial OT Evaluation Tier I: 1 Procedure OT Treatments $Self Care/Home Management : 8-22 mins G-Codes:    Harolyn Cocker L 09-Dec-2014, 11:13 AM

## 2014-11-27 NOTE — Progress Notes (Signed)
ANTICOAGULATION CONSULT NOTE - Initial Consult  Pharmacy Consult for Warfarin Dosing Indication: atrial fibrillation  Allergies  Allergen Reactions  . Ambien [Zolpidem Tartrate]   . Erythromycin Other (See Comments)    Reaction:  Unknown   . Penicillins Swelling    Patient Measurements: Height: 5\' 9"  (175.3 cm) Weight: 162 lb 8 oz (73.71 kg) IBW/kg (Calculated) : 70.7   Vital Signs: Temp: 97.6 F (36.4 C) (06/11 0757) Temp Source: Oral (06/11 0757) BP: 102/47 mmHg (06/11 0757) Pulse Rate: 74 (06/11 0757)  Labs:  Recent Labs  11/25/14 1354 11/26/14 0428 11/26/14 1058 11/27/14 0418  HGB 11.6* 10.0*  --  9.3*  HCT 35.0* 30.3*  --  28.1*  PLT 147* 111*  --  92*  LABPROT 23.8* 18.2* 16.7* 15.4*  INR 2.11 1.49 1.33 1.20  CREATININE 1.81* 1.78*  --  1.58*    Estimated Creatinine Clearance: 32.3 mL/min (by C-G formula based on Cr of 1.58).   Medical History: Past Medical History  Diagnosis Date  . Diabetes mellitus without complication   . Hypertension   . Dementia   . A-fib   . Chronic systolic CHF (congestive heart failure)   . WPW (Wolff-Parkinson-White syndrome)   . PAD (peripheral artery disease)   . Hypothyroidism   . CAD (coronary artery disease)   . Alzheimer disease   . Depression   . Hyperlipidemia     Medications:  Scheduled:  . docusate sodium  100 mg Oral BID  . donepezil  10 mg Oral q morning - 10a  . ferrous fumarate  1 tablet Oral See admin instructions  . insulin aspart  0-9 Units Subcutaneous TID WC  . insulin aspart protamine- aspart  15 Units Subcutaneous Q breakfast  . insulin aspart protamine- aspart  8 Units Subcutaneous Q supper  . levothyroxine  112 mcg Oral q morning - 10a  . memantine  10 mg Oral BID  . pantoprazole  40 mg Oral Daily  . sertraline  25 mg Oral q morning - 10a  . sevelamer carbonate  800 mg Oral BID WC  . simvastatin  20 mg Oral QHS  . torsemide  40 mg Oral q morning - 10a    Assessment: Patient is an 74  you male admitted for hip fracture.  Patient on chronic warfarin therapy for atrial fibrillation.  Home dosing for warfarin of 6 mg po daily.  INR on admission of 2.11.  Patient received Vitamin K 10 mg IV x 2.  Warfarin 6mg  resumed 6/10. INR 1.2 this AM.  Goal of Therapy:  INR 2-3 Monitor hemoglobin per policy   Plan:  Will continue home regimen of warfarin 6mg  daily, recheck INR tomorrow morning.  Pharmacy to follow per consult  Garlon Hatchet, PharmD Clinical Pharmacist 11/27/2014

## 2014-11-27 NOTE — Evaluation (Signed)
Physical Therapy Evaluation Patient Details Name: Tim Myers MRN: [id number] DOB: 07/04/1924 Today's Date: 11/27/2014   History of Present Illness  Right hip fx with ORIF  Clinical Impression  Pt is very limited with what he is able to do, he struggles to fully follow instructions, shows limited activity, clearly has considerable pain and overall needs max assist for mobility/transfers.    Follow Up Recommendations SNF    Equipment Recommendations  Rolling walker with 5" wheels    Recommendations for Other Services       Precautions / Restrictions Precautions Precautions: Fall Restrictions Weight Bearing Restrictions: Yes RLE Weight Bearing: Partial weight bearing      Mobility  Bed Mobility Overal bed mobility: +2 for physical assistance;Needs Assistance Bed Mobility: Supine to Sit     Supine to sit: Max assist     General bed mobility comments: Pt pain limited and unable to really assist much  Transfers Overall transfer level: Needs assistance Equipment used: Rolling walker (2 wheeled) Transfers: Sit to/from Stand Sit to Stand: Total assist;+2 physical assistance         General transfer comment: PT was completing transfer bed to recliner when OT arrived. Refer to PT evaluation for further details.  Ambulation/Gait Ambulation/Gait assistance: Total assist   Assistive device: Rolling walker (2 wheeled)       General Gait Details: Pt needs AAROM to even shuffle foot 1/2 inch, he is unable to stand upright and needs excessive assist to even remain upright.  Ultimately dependent assist to "safely" land in the recliner  Stairs            Wheelchair Mobility    Modified Rankin (Stroke Patients Only)       Balance Overall balance assessment: History of Falls                                           Pertinent Vitals/Pain Pain Assessment: 0-10 Pain Score: 8  Pain Location: right hip during stand for transfer, states no pain  when sitting at rest Pain Intervention(s): Limited activity within patient's tolerance;Monitored during session    Home Living Family/patient expects to be discharged to:: Private residence Living Arrangements: Spouse/significant other   Type of Home: House Home Access: Stairs to enter Entrance Stairs-Rails: Left Entrance Stairs-Number of Steps: 1 Home Layout: One level Home Equipment: None      Prior Function Level of Independence: Independent         Comments: wife provided history. States was Independent with ambulation and all self-care tasks     Hand Dominance   Dominant Hand: Right    Extremity/Trunk Assessment   Upper Extremity Assessment: LUE deficits/detail       LUE Deficits / Details: 90 shoulder flexion/abduction. History of old injury per wife.    Lower Extremity Assessment: Defer to PT evaluation RLE Deficits / Details: essentially no AROM, able to do some movement with AAROM       Communication   Communication: No difficulties  Cognition Arousal/Alertness: Lethargic;Awake/alert (alert initially (at end of PT session), became more lethargic by end of OT session.) Behavior During Therapy: [facility] for tasks assessed/performed Overall Cognitive Status: History of cognitive impairments - at baseline       Memory: Decreased short-term memory;Decreased recall of precautions              General Comments  Exercises General Exercises - Lower Extremity Ankle Circles/Pumps: 10 reps;AAROM Quad Sets: AAROM;5 reps Heel Slides: PROM;5 reps Hip ABduction/ADduction: AAROM;PROM;5 reps      Assessment/Plan    PT Assessment Patient needs continued PT services  PT Diagnosis Difficulty walking;Generalized weakness;Acute pain   PT Problem List Decreased strength;Decreased range of motion;Decreased activity tolerance;Decreased balance;Decreased mobility;Decreased coordination;Decreased safety awareness;Pain  PT Treatment Interventions Gait  training;Therapeutic activities;Therapeutic exercise;Balance training;Neuromuscular re-education;Functional mobility training   PT Goals (Current goals can be found in the Care Plan section) Acute Rehab PT Goals Patient Stated Goal: "We would rather go home..." PT Goal Formulation: With patient/family Time For Goal Achievement: 12/11/14 Potential to Achieve Goals: Fair    Frequency BID   Barriers to discharge        Co-evaluation               End of Session                 Time: 1324-4010 PT Time Calculation (min) (ACUTE ONLY): 26 min   Charges:   PT Evaluation $Initial PT Evaluation Tier I: 1 Procedure     PT G Codes:       Loran Senters, PT, DPT [phone]  Tim Myers 11/27/2014, 11:10 AM

## 2014-11-27 NOTE — Progress Notes (Signed)
Subjective:  Patient is postop day #1 from intramedullary fixation of a right intertrochanteric hip fracture. Patient reports pain as mild.  Patient is doing well. He was sleeping comfortably when I arrived and was easily arousable. He is cooperative with the exam. There are no acute issues.  Objective:   VITALS:   Filed Vitals:   11/27/14 0549 11/27/14 0552 11/27/14 0602 11/27/14 0757  BP: 115/41 103/36 126/50 102/47  Pulse: 75 74  74  Temp:    97.6 F (36.4 C)  TempSrc:    Oral  Resp:    18  Height:      Weight:      SpO2:    98%   Right lower extremity: Patient's dressing is intact with scant drainage. He mentions slight decrease in sensation to light touch in the right lower extremity compared to the left side. Motor function is intact in both lower extremities. Neurovascular intact Sensation intact distally Intact pulses distally Dorsiflexion/Plantar flexion intact Incision: scant drainage No cellulitis present Compartment soft  LABS  Results for orders placed or performed during the hospital encounter of 11/25/14 (from the past 24 hour(s))  Protime-INR     Status: Abnormal   Collection Time: 11/26/14 10:58 AM  Result Value Ref Range   Prothrombin Time 16.7 (H) 11.4 - 15.0 seconds   INR 1.33   Glucose, capillary     Status: Abnormal   Collection Time: 11/26/14 11:27 AM  Result Value Ref Range   Glucose-Capillary 185 (H) 65 - 99 mg/dL   Comment 1 Notify RN   Glucose, capillary     Status: Abnormal   Collection Time: 11/26/14  4:01 PM  Result Value Ref Range   Glucose-Capillary 145 (H) 65 - 99 mg/dL   Comment 1 Notify RN   Glucose, capillary     Status: Abnormal   Collection Time: 11/26/14  9:01 PM  Result Value Ref Range   Glucose-Capillary 153 (H) 65 - 99 mg/dL   Comment 1 Notify RN   CBC     Status: Abnormal   Collection Time: 11/27/14  4:18 AM  Result Value Ref Range   WBC 6.9 3.8 - 10.6 K/uL   RBC 3.16 (L) 4.40 - 5.90 MIL/uL   Hemoglobin 9.3 (L) 13.0  - 18.0 g/dL   HCT 16.1 (L) 09.6 - 04.5 %   MCV 88.7 80.0 - 100.0 fL   MCH 29.4 26.0 - 34.0 pg   MCHC 33.2 32.0 - 36.0 g/dL   RDW 40.9 (H) 81.1 - 91.4 %   Platelets 92 (L) 150 - 440 K/uL  Basic metabolic panel     Status: Abnormal   Collection Time: 11/27/14  4:18 AM  Result Value Ref Range   Sodium 141 135 - 145 mmol/L   Potassium 4.3 3.5 - 5.1 mmol/L   Chloride 105 101 - 111 mmol/L   CO2 27 22 - 32 mmol/L   Glucose, Bld 149 (H) 65 - 99 mg/dL   BUN 36 (H) 6 - 20 mg/dL   Creatinine, Ser 7.82 (H) 0.61 - 1.24 mg/dL   Calcium 8.3 (L) 8.9 - 10.3 mg/dL   GFR calc non Af Amer 37 (L) >60 mL/min   GFR calc Af Amer 43 (L) >60 mL/min   Anion gap 9 5 - 15  Protime-INR     Status: Abnormal   Collection Time: 11/27/14  4:18 AM  Result Value Ref Range   Prothrombin Time 15.4 (H) 11.4 - 15.0 seconds   INR 1.20  Glucose, capillary     Status: Abnormal   Collection Time: 11/27/14  7:58 AM  Result Value Ref Range   Glucose-Capillary 135 (H) 65 - 99 mg/dL    Dg Chest Port 1 View  11/25/2014   CLINICAL DATA:  Pre operative respiratory exam.  Right hip fracture.  EXAM: PORTABLE CHEST - 1 VIEW  COMPARISON:  06/22/2011  FINDINGS: Heart size and pulmonary vascularity are normal. Tortuosity and calcification of the thoracic aorta. Slight no infiltrates or effusions. CABG. Pacemaker and AICD. Osteopenia. No acute osseous abnormality. Nodular density in the left midzone is unchanged and probably represents an end on blood vessel.  IMPRESSION: No acute abnormality.   Electronically Signed   By: Francene Boyers M.D.   On: 11/25/2014 14:38   Dg C-arm 61-120 Min  11/26/2014   CLINICAL DATA:  ORIF of proximal right femoral fracture  EXAM: DG C-ARM 61-120 MIN; RIGHT FEMUR PORTABLE 1 VIEW  COMPARISON:  11/25/2014  FLUOROSCOPY TIME:  Radiation Exposure Index (as provided by the fluoroscopic device): 9.75 mGy  If the device does not provide the exposure index:  Fluoroscopy Time:  1 minutes 13 seconds  Number of  Acquired Images:  6  FINDINGS: Proximal medullary rod and compression screw are now seen in the proximal right femur. The fracture fragments are in near anatomic alignment. No other focal abnormality is seen.  IMPRESSION: Status post ORIF of proximal right femoral fracture   Electronically Signed   By: Alcide Clever M.D.   On: 11/26/2014 14:01   Dg Hip Port Unilat With Pelvis 1v Right  11/26/2014   CLINICAL DATA:  Right hip fracture.  EXAM: RIGHT HIP (WITH PELVIS) 1 VIEW PORTABLE  COMPARISON:  11/25/2014  FINDINGS: The patient has undergone open reduction and internal fixation of the intertrochanteric fracture of the proximal right femur. Alignment and position of the fracture fragments is near anatomic. Intramedullary nail and screw have been inserted and appear in good position. Visualized pelvic bones are intact.  IMPRESSION: Satisfactory appearance of the right hip after open reduction and internal fixation of intertrochanteric fracture.   Electronically Signed   By: Francene Boyers M.D.   On: 11/26/2014 14:45   Dg Hip Unilat With Pelvis 2-3 Views Right  11/25/2014   CLINICAL DATA:  Patient found down. RIGHT hip pain. Initial encounter.  EXAM: RIGHT HIP (WITH PELVIS) 2-3 VIEWS  COMPARISON:  None.  FINDINGS: Intertrochanteric RIGHT femur fracture is present. Diffuse osteopenia. The RIGHT femoral head remains located. Aortoiliac stent graft is present with embolization coils in the RIGHT anatomic pelvis. Severe atherosclerosis.  The obturator rings appear intact.  IMPRESSION: Intertrochanteric RIGHT femur fracture.   Electronically Signed   By: Andreas Newport M.D.   On: 11/25/2014 14:30   Dg Femur Port, Min 2 Views Right  11/26/2014   CLINICAL DATA:  ORIF of proximal right femoral fracture  EXAM: DG C-ARM 61-120 MIN; RIGHT FEMUR PORTABLE 1 VIEW  COMPARISON:  11/25/2014  FLUOROSCOPY TIME:  Radiation Exposure Index (as provided by the fluoroscopic device): 9.75 mGy  If the device does not provide the exposure  index:  Fluoroscopy Time:  1 minutes 13 seconds  Number of Acquired Images:  6  FINDINGS: Proximal medullary rod and compression screw are now seen in the proximal right femur. The fracture fragments are in near anatomic alignment. No other focal abnormality is seen.  IMPRESSION: Status post ORIF of proximal right femoral fracture   Electronically Signed   By: Eulah Pont.D.  On: 11/26/2014 14:01    Assessment/Plan: 1 Day Post-Op   Active Problems:   Hip fracture requiring operative repair  Patient doing well on postoperative day 1. Hemoglobin and hematocrit are stable. Encourage incentive spirometry. Complete 24 hours of postop antibiotics. Coumadin has been restarted. Begin physical and occupational therapy today. Recheck labs in the morning.    Juanell Fairly , MD 11/27/2014, 8:31 AM

## 2014-11-27 NOTE — Progress Notes (Signed)
POD 1, right hip. Pain controlled w/ PRN IV and PO meds. Rested quietly between care. Cooperative and able to follow commands. No acute distress, will cont to monitor.

## 2014-11-28 LAB — BASIC METABOLIC PANEL
ANION GAP: 4 — AB (ref 5–15)
BUN: 42 mg/dL — ABNORMAL HIGH (ref 6–20)
CHLORIDE: 105 mmol/L (ref 101–111)
CO2: 26 mmol/L (ref 22–32)
Calcium: 7.9 mg/dL — ABNORMAL LOW (ref 8.9–10.3)
Creatinine, Ser: 1.69 mg/dL — ABNORMAL HIGH (ref 0.61–1.24)
GFR calc Af Amer: 40 mL/min — ABNORMAL LOW (ref >60)
GFR calc non Af Amer: 34 mL/min — ABNORMAL LOW (ref >60)
Glucose, Bld: 84 mg/dL (ref 65–99)
Potassium: 4.1 mmol/L (ref 3.5–5.1)
Sodium: 135 mmol/L (ref 135–145)

## 2014-11-28 LAB — CBC
HEMATOCRIT: 26 % — AB (ref 40.0–52.0)
Hemoglobin: 8.7 g/dL — ABNORMAL LOW (ref 13.0–18.0)
MCH: 29.2 pg (ref 26.0–34.0)
MCHC: 33.6 g/dL (ref 32.0–36.0)
MCV: 87 fL (ref 80.0–100.0)
Platelets: 91 10*3/uL — ABNORMAL LOW (ref 150–440)
RBC: 2.98 MIL/uL — ABNORMAL LOW (ref 4.40–5.90)
RDW: 16.2 % — ABNORMAL HIGH (ref 11.5–14.5)
WBC: 6.2 10*3/uL (ref 3.8–10.6)

## 2014-11-28 LAB — GLUCOSE, CAPILLARY
Glucose-Capillary: 88 mg/dL (ref 65–99)
Glucose-Capillary: 112 mg/dL — ABNORMAL HIGH (ref 65–99)
Glucose-Capillary: 160 mg/dL — ABNORMAL HIGH (ref 65–99)
Glucose-Capillary: 159 mg/dL — ABNORMAL HIGH (ref 65–99)

## 2014-11-28 LAB — PROTIME-INR
INR: 1.26
PROTHROMBIN TIME: 16 s — AB (ref 11.4–15.0)

## 2014-11-28 MED ORDER — WARFARIN SODIUM 2 MG PO TABS
6.0000 mg | ORAL_TABLET | Freq: Every day | ORAL | Status: DC
  Administered 2014-11-28: 6 mg via ORAL
  Filled 2014-11-28: qty 3

## 2014-11-28 NOTE — Progress Notes (Signed)
Subjective:  Patient is postop day #2 from intramedullary fixation of a right intertrochanteric hip fracture. He is resting comfortably in bed. His family at the bedside. Patient reports pain as mild.  Patient drowsy but arousable. He is cooperative with the exam.  He has urinated and had a bowel movement after suppository.  Objective:   VITALS:   Filed Vitals:   11/27/14 2038 11/28/14 0530 11/28/14 0748 11/28/14 0803  BP: 101/46 115/44 119/52   Pulse: 74 74 73 76  Temp: 98 F (36.7 C) 98.4 F (36.9 C) 97.5 F (36.4 C)   TempSrc: Oral Oral Oral   Resp: Height:      Weight:      SpO2: 94% 94% 81% 96%   Right lower extremity: Neurovascular intact Sensation intact distally Intact pulses distally Dorsiflexion/Plantar flexion intact Incision: scant drainage No cellulitis present Compartment soft  LABS  Results for orders placed or performed during the hospital encounter of 11/25/14 (from the past 24 hour(s))  Glucose, capillary     Status: Abnormal   Collection Time: 11/27/14 12:46 PM  Result Value Ref Range   Glucose-Capillary 139 (H) 65 - 99 mg/dL  Glucose, capillary     Status: None   Collection Time: 11/27/14  4:03 PM  Result Value Ref Range   Glucose-Capillary 86 65 - 99 mg/dL   Comment 1 Notify RN   Glucose, capillary     Status: None   Collection Time: 11/27/14  8:58 PM  Result Value Ref Range   Glucose-Capillary 96 65 - 99 mg/dL  CBC     Status: Abnormal   Collection Time: 11/28/14  5:02 AM  Result Value Ref Range   WBC 6.2 3.8 - 10.6 K/uL   RBC 2.98 (L) 4.40 - 5.90 MIL/uL   Hemoglobin 8.7 (L) 13.0 - 18.0 g/dL   HCT 45.4 (L) 09.8 - 11.9 %   MCV 87.0 80.0 - 100.0 fL   MCH 29.2 26.0 - 34.0 pg   MCHC 33.6 32.0 - 36.0 g/dL   RDW 14.7 (H) 82.9 - 56.2 %   Platelets 91 (L) 150 - 440 K/uL  Basic metabolic panel     Status: Abnormal   Collection Time: 11/28/14  5:02 AM  Result Value Ref Range   Sodium 135 135 - 145 mmol/L   Potassium 4.1 3.5 - 5.1  mmol/L   Chloride 105 101 - 111 mmol/L   CO2 26 22 - 32 mmol/L   Glucose, Bld 84 65 - 99 mg/dL   BUN 42 (H) 6 - 20 mg/dL   Creatinine, Ser 1.30 (H) 0.61 - 1.24 mg/dL   Calcium 7.9 (L) 8.9 - 10.3 mg/dL   GFR calc non Af Amer 34 (L) >60 mL/min   GFR calc Af Amer 40 (L) >60 mL/min   Anion gap 4 (L) 5 - 15  Protime-INR     Status: Abnormal   Collection Time: 11/28/14  5:02 AM  Result Value Ref Range   Prothrombin Time 16.0 (H) 11.4 - 15.0 seconds   INR 1.26   Glucose, capillary     Status: None   Collection Time: 11/28/14  7:48 AM  Result Value Ref Range   Glucose-Capillary 88 65 - 99 mg/dL    Dg C-arm 86-578 Min  11/26/2014   CLINICAL DATA:  ORIF of proximal right femoral fracture  EXAM: DG C-ARM 61-120 MIN; RIGHT FEMUR PORTABLE 1 VIEW  COMPARISON:  11/25/2014  FLUOROSCOPY TIME:  Radiation Exposure Index (  as provided by the fluoroscopic device): 9.75 mGy  If the device does not provide the exposure index:  Fluoroscopy Time:  1 minutes 13 seconds  Number of Acquired Images:  6  FINDINGS: Proximal medullary rod and compression screw are now seen in the proximal right femur. The fracture fragments are in near anatomic alignment. No other focal abnormality is seen.  IMPRESSION: Status post ORIF of proximal right femoral fracture   Electronically Signed   By: Alcide Clever M.D.   On: 11/26/2014 14:01   Dg Hip Port Unilat With Pelvis 1v Right  11/26/2014   CLINICAL DATA:  Right hip fracture.  EXAM: RIGHT HIP (WITH PELVIS) 1 VIEW PORTABLE  COMPARISON:  11/25/2014  FINDINGS: The patient has undergone open reduction and internal fixation of the intertrochanteric fracture of the proximal right femur. Alignment and position of the fracture fragments is near anatomic. Intramedullary nail and screw have been inserted and appear in good position. Visualized pelvic bones are intact.  IMPRESSION: Satisfactory appearance of the right hip after open reduction and internal fixation of intertrochanteric fracture.    Electronically Signed   By: Francene Boyers M.D.   On: 11/26/2014 14:45   Dg Femur Port, Min 2 Views Right  11/26/2014   CLINICAL DATA:  ORIF of proximal right femoral fracture  EXAM: DG C-ARM 61-120 MIN; RIGHT FEMUR PORTABLE 1 VIEW  COMPARISON:  11/25/2014  FLUOROSCOPY TIME:  Radiation Exposure Index (as provided by the fluoroscopic device): 9.75 mGy  If the device does not provide the exposure index:  Fluoroscopy Time:  1 minutes 13 seconds  Number of Acquired Images:  6  FINDINGS: Proximal medullary rod and compression screw are now seen in the proximal right femur. The fracture fragments are in near anatomic alignment. No other focal abnormality is seen.  IMPRESSION: Status post ORIF of proximal right femoral fracture   Electronically Signed   By: Alcide Clever M.D.   On: 11/26/2014 14:01    Assessment/Plan: 2 Days Post-Op   Active Problems:   Hip fracture requiring operative repair  Patient remains stable postop. He should continue physical and occupational therapy. Continue incentive spirometry.     Juanell Fairly , MD 11/28/2014, 12:13 PM

## 2014-11-28 NOTE — Progress Notes (Signed)
Physical Therapy Treatment Patient Details Name: Tim Myers MRN: [id number] DOB: 07/04/1924 Today's Date: 11/28/2014    History of Present Illness Pt with fall and R hip fx, ORIF    PT Comments    Pt continues to be very pain focused, struggles to do much AROM/help with exercises, and generally needs a lot of cuing and instruction to do even basic PT acts.  He shows very little ability to do any standing and ultimately lets his pain limit all aspects of PT.   Follow Up Recommendations  SNF     Equipment Recommendations  Rolling walker with 5" wheels    Recommendations for Other Services       Precautions / Restrictions Precautions Precautions: Fall Restrictions Weight Bearing Restrictions: Yes RLE Weight Bearing: Partial weight bearing    Mobility  Bed Mobility Overal bed mobility: Needs Assistance Bed Mobility: Sit to Supine;Supine to Sit     Supine to sit: Max assist;Mod assist Sit to supine: Max assist;Total assist   General bed mobility comments: Pt very pain limited  Transfers Overall transfer level: Needs assistance Equipment used: Rolling walker (2 wheeled) Transfers: Sit to/from Stand Sit to Stand: Total assist;+2 physical assistance         General transfer comment: Pt shows very little effort with 2 attempts at standing and much encouragement/cuing from PT and family, he needs heavy assist and is ultimately unable to get his bottom more than an inch off the bed.    Ambulation/Gait Ambulation/Gait assistance:  (unable and inappropriate for ambulation at this time)               Careers information officer    Modified Rankin (Stroke Patients Only)       Balance                                    Cognition Arousal/Alertness: Awake/alert Behavior During Therapy: Anxious;Restless;Agitated Overall Cognitive Status: History of cognitive impairments - at baseline                      Exercises  General Exercises - Lower Extremity Ankle Circles/Pumps: 10 reps;AAROM Quad Sets: AAROM;5 reps Short Arc Quad: AAROM;10 reps Heel Slides: PROM;10 reps Hip ABduction/ADduction: AAROM;PROM;5 reps    General Comments        Pertinent Vitals/Pain      Home Living                      Prior Function            PT Goals (current goals can now be found in the care plan section) Acute Rehab PT Goals PT Goal Formulation: With patient/family Time For Goal Achievement: 12/11/14 Potential to Achieve Goals: Fair Progress towards PT goals:  (very slow progression, pain limited)    Frequency  BID    PT Plan Current plan remains appropriate    Co-evaluation             End of Session Equipment Utilized During Treatment: Gait belt         Time: 4098-1191 PT Time Calculation (min) (ACUTE ONLY): 43 min  Charges:  $Therapeutic Exercise: 23-37 mins $Therapeutic Activity: 8-22 mins                    G Codes:  Loran Senters, PT, DPT [phone]  Malachi Pro 11/28/2014, 11:46 AM

## 2014-11-28 NOTE — Progress Notes (Signed)
ANTICOAGULATION CONSULT NOTE - Initial Consult  Pharmacy Consult for Warfarin Dosing Indication: atrial fibrillation  Allergies  Allergen Reactions  . Ambien [Zolpidem Tartrate]   . Erythromycin Other (See Comments)    Reaction:  Unknown   . Penicillins Swelling    Patient Measurements: Height: 5\' 9"  (175.3 cm) Weight: 162 lb 8 oz (73.71 kg) IBW/kg (Calculated) : 70.7   Vital Signs: Temp: 97.5 F (36.4 C) (06/12 0748) Temp Source: Oral (06/12 0748) BP: 119/52 mmHg (06/12 0748) Pulse Rate: 76 (06/12 0803)  Labs:  Recent Labs  11/26/14 0428 11/26/14 1058 11/27/14 0418 11/28/14 0502  HGB 10.0*  --  9.3* 8.7*  HCT 30.3*  --  28.1* 26.0*  PLT 111*  --  92* 91*  LABPROT 18.2* 16.7* 15.4* 16.0*  INR 1.49 1.33 1.20 1.26  CREATININE 1.78*  --  1.58* 1.69*    Estimated Creatinine Clearance: 30.2 mL/min (by C-G formula based on Cr of 1.69).   Medical History: Past Medical History  Diagnosis Date  . Diabetes mellitus without complication   . Hypertension   . Dementia   . A-fib   . Chronic systolic CHF (congestive heart failure)   . WPW (Wolff-Parkinson-White syndrome)   . PAD (peripheral artery disease)   . Hypothyroidism   . CAD (coronary artery disease)   . Alzheimer disease   . Depression   . Hyperlipidemia     Medications:  Scheduled:  . amLODipine  5 mg Oral Daily  . docusate sodium  100 mg Oral BID  . donepezil  10 mg Oral q morning - 10a  . ferrous fumarate  1 tablet Oral See admin instructions  . insulin aspart  0-9 Units Subcutaneous TID WC  . insulin aspart protamine- aspart  15 Units Subcutaneous Q breakfast  . insulin aspart protamine- aspart  8 Units Subcutaneous Q supper  . levothyroxine  112 mcg Oral q morning - 10a  . memantine  10 mg Oral BID  . pantoprazole  40 mg Oral Daily  . sertraline  25 mg Oral q morning - 10a  . sevelamer carbonate  800 mg Oral BID WC  . simvastatin  20 mg Oral QHS  . torsemide  40 mg Oral q morning - 10a  .  Warfarin - Pharmacist Dosing Inpatient   Does not apply q1800    Assessment: Patient is an 62 you male admitted for hip fracture.  Patient on chronic warfarin therapy for atrial fibrillation.  Home dosing for warfarin of 6 mg po daily.  INR on admission of 2.11.  Patient received Vitamin K 10 mg IV x 2.  Warfarin 6mg  resumed 6/10. INR 1.26 this AM.  Goal of Therapy:  INR 2-3 Monitor hemoglobin per policy   Plan:  Will continue home regimen of warfarin 6mg  daily, recheck INR tomorrow morning.  Pharmacy to follow per consult  Garlon Hatchet, PharmD Clinical Pharmacist 11/28/2014

## 2014-11-28 NOTE — Progress Notes (Signed)
[facility] [place] [facility]   PATIENT NAME: Tim Myers    MR#:  [id number]  DATE OF BIRTH:  07/04/1924  SUBJECTIVE:  CHIEF COMPLAINT:   Chief Complaint  Patient presents with  . Fall   S/p sx 11/26/14. C/o pain in hip.  REVIEW OF SYSTEMS:    Review of Systems  Constitutional: Positive for malaise/fatigue. Negative for fever and chills.  HENT: Negative for sore throat.   Eyes: Negative for blurred vision, double vision and pain.  Respiratory: Negative for cough, hemoptysis, shortness of breath and wheezing.   Cardiovascular: Negative for chest pain, palpitations, orthopnea and leg swelling.  Gastrointestinal: Negative for heartburn, nausea, vomiting, abdominal pain, diarrhea and constipation.  Genitourinary: Negative for dysuria and hematuria.  Musculoskeletal: Positive for joint pain. Negative for back pain.  Skin: Negative for rash.  Neurological: Positive for weakness. Negative for sensory change, speech change, focal weakness and headaches.  Endo/Heme/Allergies: Does not bruise/bleed easily.  Psychiatric/Behavioral: Positive for memory loss. Negative for depression. The patient is not nervous/anxious.     DRUG ALLERGIES:   Allergies  Allergen Reactions  . Ambien [Zolpidem Tartrate]   . Erythromycin Other (See Comments)    Reaction:  Unknown   . Penicillins Swelling    VITALS:  Blood pressure 119/52, pulse 76, temperature 97.5 F (36.4 C), temperature source Oral, resp. rate 16, height 5\' 9"  (1.753 m), weight 73.71 kg (162 lb 8 oz), SpO2 96 %.  PHYSICAL EXAMINATION:   Physical Exam  GENERAL:  81 y.o.-year-old patient lying in the bed with no acute distress.  EYES: Pupils equal, round, reactive to light and accommodation. No scleral icterus. Extraocular muscles intact.  HEENT: Head atraumatic, normocephalic. Oropharynx and nasopharynx clear.  NECK:  Supple, no jugular venous distention. No thyroid enlargement, no tenderness.   LUNGS: Normal breath sounds bilaterally, no wheezing, rales, rhonchi. No use of accessory muscles of respiration.  CARDIOVASCULAR: S1, S2 normal. No murmurs, rubs, or gallops.  ABDOMEN: Soft, nontender, nondistended. Bowel sounds present. No organomegaly or mass.  EXTREMITIES: No cyanosis, clubbing or edema b/l.   Tender right hip NEUROLOGIC: Cranial nerves II through XII are intact. No focal Motor or sensory deficits b/l.   PSYCHIATRIC: The patient is alert and oriented x 3.  SKIN: No obvious rash, lesion, or ulcer.    LABORATORY PANEL:   CBC  Recent Labs Lab 11/28/14 0502  WBC 6.2  HGB 8.7*  HCT 26.0*  PLT 91*   ------------------------------------------------------------------------------------------------------------------  Chemistries   Recent Labs Lab 11/28/14 0502  NA 135  K 4.1  CL 105  CO2 26  GLUCOSE 84  BUN 42*  CREATININE 1.69*  CALCIUM 7.9*   ------------------------------------------------------------------------------------------------------------------  Cardiac Enzymes No results for input(s): TROPONINI in the last 168 hours. ------------------------------------------------------------------------------------------------------------------  RADIOLOGY:  Dg C-arm 61-120 Min  11/26/2014   CLINICAL DATA:  ORIF of proximal right femoral fracture  EXAM: DG C-ARM 61-120 MIN; RIGHT FEMUR PORTABLE 1 VIEW  COMPARISON:  11/25/2014  FLUOROSCOPY TIME:  Radiation Exposure Index (as provided by the fluoroscopic device): 9.75 mGy  If the device does not provide the exposure index:  Fluoroscopy Time:  1 minutes 13 seconds  Number of Acquired Images:  6  FINDINGS: Proximal medullary rod and compression screw are now seen in the proximal right femur. The fracture fragments are in near anatomic alignment. No other focal abnormality is seen.  IMPRESSION: Status post ORIF of proximal right femoral fracture   Electronically Signed   By: Loraine Leriche  Lukens M.D.   On: 11/26/2014 14:01    Dg Hip Port Unilat With Pelvis 1v Right  11/26/2014   CLINICAL DATA:  Right hip fracture.  EXAM: RIGHT HIP (WITH PELVIS) 1 VIEW PORTABLE  COMPARISON:  11/25/2014  FINDINGS: The patient has undergone open reduction and internal fixation of the intertrochanteric fracture of the proximal right femur. Alignment and position of the fracture fragments is near anatomic. Intramedullary nail and screw have been inserted and appear in good position. Visualized pelvic bones are intact.  IMPRESSION: Satisfactory appearance of the right hip after open reduction and internal fixation of intertrochanteric fracture.   Electronically Signed   By: Francene Boyers M.D.   On: 11/26/2014 14:45   Dg Femur Port, Min 2 Views Right  11/26/2014   CLINICAL DATA:  ORIF of proximal right femoral fracture  EXAM: DG C-ARM 61-120 MIN; RIGHT FEMUR PORTABLE 1 VIEW  COMPARISON:  11/25/2014  FLUOROSCOPY TIME:  Radiation Exposure Index (as provided by the fluoroscopic device): 9.75 mGy  If the device does not provide the exposure index:  Fluoroscopy Time:  1 minutes 13 seconds  Number of Acquired Images:  6  FINDINGS: Proximal medullary rod and compression screw are now seen in the proximal right femur. The fracture fragments are in near anatomic alignment. No other focal abnormality is seen.  IMPRESSION: Status post ORIF of proximal right femoral fracture   Electronically Signed   By: Alcide Clever M.D.   On: 11/26/2014 14:01     ASSESSMENT AND PLAN:   * Right intertrochanteric fracture from fall Surgery on 11/26/14 Pain management. PT consult.  need SNF at discharge.likely d./c tomorrow.  * Acute blood loss anemia from fracture  Monitor. Transfuse PRN per ortho.  * Persistent Afib On coumadin. Vitamin K given prior to surgery. Restarted coumadin. pharmacy to manage dose.  Unexplained fall- will get cardiology to help with defibrillator.  * chronic systolic congestive heart failure - EF 25% Continue torsemide. No IVF after  surgery.  encouraged to use incentive spirometry.  * history of diabetes mellitus Continue insulin human 70/30 15 units a.m. and 10 units at dinnertime Continue sliding scale insulin  * history of coronary artery disease status post CABG  * history of dementia-continue Namenda  * history of hypertension- add amlodipin.  * Dementia Monitor for any inpatient delirium   All the records are reviewed and case discussed with Care Management/Social Workerr. Management plans discussed with the patient, family and they are in agreement.  CODE STATUS: FULL CODE  DVT Prophylaxis: SCDs  TOTAL TIME TAKING CARE OF THIS PATIENT: 35 minutes.   POSSIBLE D/C IN 2-3 DAYS, DEPENDING ON CLINICAL CONDITION.   Altamese Dilling M.D on 11/28/2014 at 11:05 AM  Between 7am to 6pm - Pager - [phone]  After 6pm go to [email or web address] - password [email or web address] [facility]  [place] [facility]  Office  [phone]  CC: Primary care physician; Lauro Regulus., MD

## 2014-11-28 NOTE — Clinical Social Work Note (Signed)
Clinical Social Work Assessment  Patient Details  Name: Tim Myers MRN: [id number] Date of Birth: 07/04/1924  Date of referral:  11/28/14               Reason for consult:  Facility Placement                Permission sought to share information with:  Facility Medical sales representative, Family Supports Permission granted to share information::  Yes, Verbal Permission Granted  Name::        Agency::  [facility]  Relationship::     Contact Information:     Housing/Transportation Living arrangements for the past 2 months:  Single Family Home Source of Information:  Spouse Patient Interpreter Needed:  None Criminal Activity/Legal Involvement Pertinent to Current Situation/Hospitalization:  No - Comment as needed Significant Relationships:  Spouse, Adult Children Lives with:  Spouse Do you feel safe going back to the place where you live?  Yes Need for family participation in patient care:  Yes (Comment)  Care giving concerns:     Social Worker assessment / plan:  CSSW spoke with pt's wife, Tim Myers, re: PT recommendation for SNF. Pt with no previous SNF stay. CSW explained placement process and answered questions. Pt's wife reports preference is [facility] or [facility]. FL2 complete and SNF search initiated. Weekday CSW to f/u with offers.   Employment status:  Retired Health and safety inspector:  Medicare PT Recommendations:  Skilled Nursing Facility Information / Referral to community resources:  Skilled Nursing Facility  Patient/Family's Response to care:  Pt no oriented to situation at time of visit. Pt's wife reports agreeable to above plan.    Patient/Family's Understanding of and Emotional Response to Diagnosis, Current Treatment, and Prognosis:   Emotional Assessment Appearance:  Appears stated age Attitude/Demeanor/Rapport:    Affect (typically observed):    Orientation:  Oriented to Self Alcohol / Substance use:  Not Applicable Psych involvement (Current and  /or in the community):  No (Comment)  Discharge Needs  Concerns to be addressed:  Discharge Planning Concerns Readmission within the last 30 days:  No Current discharge risk:  None Barriers to Discharge:  No Barriers Identified   Deatra Robinson, LCSW 11/28/2014, 2:09 PM

## 2014-11-29 ENCOUNTER — Encounter
Admission: RE | Admit: 2014-11-29 | Discharge: 2014-11-29 | Disposition: A | Discharge status: Home / Self Care | Payer: Medicare Other | Source: Ambulatory Visit | Attending: Internal Medicine | Admitting: Internal Medicine

## 2014-11-29 DIAGNOSIS — D649 Anemia, unspecified: Secondary | ICD-10-CM | POA: Insufficient documentation

## 2014-11-29 DIAGNOSIS — E1140 Type 2 diabetes mellitus with diabetic neuropathy, unspecified: Secondary | ICD-10-CM | POA: Insufficient documentation

## 2014-11-29 DIAGNOSIS — E039 Hypothyroidism, unspecified: Secondary | ICD-10-CM | POA: Insufficient documentation

## 2014-11-29 DIAGNOSIS — R410 Disorientation, unspecified: Secondary | ICD-10-CM | POA: Insufficient documentation

## 2014-11-29 DIAGNOSIS — R350 Frequency of micturition: Secondary | ICD-10-CM | POA: Insufficient documentation

## 2014-11-29 DIAGNOSIS — I4891 Unspecified atrial fibrillation: Secondary | ICD-10-CM | POA: Insufficient documentation

## 2014-11-29 LAB — TYPE AND SCREEN
ABO/RH(D): O NEG
Antibody Screen: NEGATIVE
Unit division: 0

## 2014-11-29 LAB — CBC
HEMATOCRIT: 25.1 % — AB (ref 40.0–52.0)
Hemoglobin: 8.4 g/dL — ABNORMAL LOW (ref 13.0–18.0)
MCH: 29.3 pg (ref 26.0–34.0)
MCHC: 33.5 g/dL (ref 32.0–36.0)
MCV: 87.4 fL (ref 80.0–100.0)
Platelets: 105 10*3/uL — ABNORMAL LOW (ref 150–440)
RBC: 2.88 MIL/uL — ABNORMAL LOW (ref 4.40–5.90)
RDW: 16.3 % — ABNORMAL HIGH (ref 11.5–14.5)
WBC: 6.1 10*3/uL (ref 3.8–10.6)

## 2014-11-29 LAB — PROTIME-INR
INR: 1.26
Prothrombin Time: 16 seconds — ABNORMAL HIGH (ref 11.4–15.0)

## 2014-11-29 LAB — GLUCOSE, CAPILLARY
GLUCOSE-CAPILLARY: 128 mg/dL — AB (ref 65–99)
Glucose-Capillary: 157 mg/dL — ABNORMAL HIGH (ref 65–99)

## 2014-11-29 MED ORDER — TRAMADOL HCL 50 MG PO TABS: 50.0000 mg | ORAL_TABLET | Freq: Four times a day (QID) | ORAL | Status: DC | PRN

## 2014-11-29 MED ORDER — ALUM & MAG HYDROXIDE-SIMETH 200-200-20 MG/5ML PO SUSP
30.0000 mL | ORAL | Status: AC | PRN
Start: 2014-11-29 — End: ?

## 2014-11-29 MED ORDER — TAMSULOSIN HCL 0.4 MG PO CAPS
0.4000 mg | ORAL_CAPSULE | Freq: Every day | ORAL | Status: DC
  Administered 2014-11-29: 0.4 mg via ORAL
  Filled 2014-11-29: qty 1

## 2014-11-29 MED ORDER — TAMSULOSIN HCL 0.4 MG PO CAPS: 0.4000 mg | ORAL_CAPSULE | Freq: Every day | ORAL | Status: DC

## 2014-11-29 MED ORDER — ENOXAPARIN SODIUM 30 MG/0.3ML ~~LOC~~ SOLN: 30.0000 mg | Freq: Two times a day (BID) | SUBCUTANEOUS | Status: DC

## 2014-11-29 MED ORDER — MENTHOL 3 MG MT LOZG: 1.0000 | LOZENGE | OROMUCOSAL | Status: DC | PRN

## 2014-11-29 MED ORDER — AMLODIPINE BESYLATE 5 MG PO TABS: 5.0000 mg | ORAL_TABLET | Freq: Every day | ORAL | Status: DC

## 2014-11-29 MED ORDER — ENOXAPARIN SODIUM 30 MG/0.3ML ~~LOC~~ SOLN
30.0000 mg | Freq: Two times a day (BID) | SUBCUTANEOUS | Status: DC
  Administered 2014-11-29: 30 mg via SUBCUTANEOUS
  Filled 2014-11-29: qty 0.3

## 2014-11-29 MED ORDER — BISACODYL 10 MG RE SUPP: 10.0000 mg | Freq: Every day | RECTAL | Status: DC | PRN

## 2014-11-29 MED ORDER — OXYCODONE HCL 5 MG PO TABS: 5.0000 mg | ORAL_TABLET | ORAL | Status: DC | PRN

## 2014-11-29 MED ORDER — WARFARIN SODIUM 2 MG PO TABS
7.0000 mg | ORAL_TABLET | Freq: Every day | ORAL | Status: DC
  Administered 2014-11-29: 7 mg via ORAL
  Filled 2014-11-29: qty 1

## 2014-11-29 NOTE — Progress Notes (Signed)
Physical Therapy Treatment Patient Details Name: Tim Myers MRN: [id number] DOB: 07/04/1924 Today's Date: 11/29/2014    History of Present Illness 81 year old male who came to [facility] after a hip fracture, recieving an ORIF repair.    PT Comments    Refused out of bed this a.m.; planned to perform this p.m; however, pt currently has discharge orders to skilled nursing facility . Pt slowly progressing, but family in room pleased with increased ability to tolerate exercises and sit edge of bed today.   Follow Up Recommendations  SNF     Equipment Recommendations  Rolling walker with 5" wheels    Recommendations for Other Services       Precautions / Restrictions Precautions Precautions: Fall Restrictions Weight Bearing Restrictions: Yes RLE Weight Bearing: Partial weight bearing    Mobility  Bed Mobility Overal bed mobility: Needs Assistance Bed Mobility: Sit to Supine       Sit to supine: Mod assist;HOB elevated      Transfers Overall transfer level: Needs assistance Equipment used: Rolling walker (2 wheeled)             General transfer comment: Refused out of bed at this time due to pain  Ambulation/Gait                 Stairs            Wheelchair Mobility    Modified Rankin (Stroke Patients Only)       Balance Overall balance assessment: Needs assistance Sitting-balance support: Bilateral upper extremity supported;Feet supported Sitting balance-Leahy Scale: Fair Sitting balance - Comments: Initially required Min A to maintain sitting balance. Improved with time and cueing to shift weight forward versus pushing back. Eventually able to sit with BUE and SBA. Postural control: Posterior lean;Left lateral lean                          Cognition Arousal/Alertness: Awake/alert (A little sleepy) Behavior During Therapy: WFL for tasks assessed/performed;Agitated (Mildly due to pain, but also demonstrates humor)                         Exercises General Exercises - Lower Extremity Ankle Circles/Pumps: AROM;Both;20 reps;Supine (cues to slow down with larger range) Quad Sets: Strengthening;Both;20 reps;Supine (very weak on right) Gluteal Sets: Strengthening;Both;20 reps;Supine Short Arc Quad: AAROM;Right;20 reps;Supine (AROM on L ) Long Arc Quad: AAROM;Right;20 reps;Seated (AROM on L) Heel Slides: AAROM;Right;20 reps;Supine (AROM L) Hip ABduction/ADduction: AAROM;Both;20 reps;Supine Straight Leg Raises: AAROM;Both;10 reps;Supine Hip Flexion/Marching: AAROM;Right;20 reps;Seated (AROM on L) Toe Raises: AROM;Both;15 reps;Seated Heel Raises: AROM;Both;15 reps;Seated    General Comments        Pertinent Vitals/Pain Pain Assessment: 0-10 Pain Score: 8  Pain Location: R hip/LE Pain Intervention(s): Limited activity within patient's tolerance;Monitored during session;RN gave pain meds during session    Home Living                      Prior Function            PT Goals (current goals can now be found in the care plan section) Progress towards PT goals: Progressing toward goals (Slowly)    Frequency  BID    PT Plan Current plan remains appropriate    Co-evaluation             End of Session   Activity Tolerance: Patient limited by pain Patient left: in  bed;with call bell/phone within reach;with bed alarm set;with family/visitor present     Time: 2035-5974 PT Time Calculation (min) (ACUTE ONLY): 47 min  Charges:  $Therapeutic Exercise: 23-37 mins $Therapeutic Activity: 8-22 mins                    G Codes:      Kristeen Miss 11/29/2014, 1:37 PM

## 2014-11-29 NOTE — Discharge Instructions (Addendum)
Nurse at rehab to check bladder scan every 8 hrs- and if residual urine > 300 ml- do In and out catheter. If pt does not resolve urinary issue for 3-4 days- need to consult urologist from rehab.  ORTHO DISCHARGE INSTRUCTIONS FOR RIGHT INTERTROCHANTERIC HIP FRACTURE:  Continue 50% partial weightbearing on the right lower extremity 1 month postop. Continue to use TED stockings until follow-up. Patient may remove them at night for sleep. Elevate the right lower extremity whenever possible. Keep incision clean and dry.  Continue adjusted dose coumadin for a. Fib and  blood clot prevention. Continue to work on knee and hip range of motion exercises at rehab as instructed by physical therapy. Continue to use a walker for assistance with ambulation until follow-up.

## 2014-11-29 NOTE — Progress Notes (Signed)
MD notified of pt inability to void and bladder scan results of >999. Willis MD to put in orders for in and out cath

## 2014-11-29 NOTE — Progress Notes (Signed)
ANTICOAGULATION CONSULT NOTE - Initial Consult  Pharmacy Consult for Warfarin Dosing Indication: atrial fibrillation  Allergies  Allergen Reactions  . Ambien [Zolpidem Tartrate]   . Erythromycin Other (See Comments)    Reaction:  Unknown   . Penicillins Swelling    Patient Measurements: Height: 5\' 9"  (175.3 cm) Weight: 162 lb 8 oz (73.71 kg) IBW/kg (Calculated) : 70.7   Vital Signs: Temp: 98.3 F (36.8 C) (06/13 0414) Temp Source: Oral (06/13 0414) BP: 112/55 mmHg (06/13 0414) Pulse Rate: 74 (06/13 0414)  Labs:  Recent Labs  11/27/14 0418 11/28/14 0502 11/29/14 0337  HGB 9.3* 8.7* 8.4*  HCT 28.1* 26.0* 25.1*  PLT 92* 91* 105*  LABPROT 15.4* 16.0* 16.0*  INR 1.20 1.26 1.26  CREATININE 1.58* 1.69*  --     Estimated Creatinine Clearance: 30.2 mL/min (by C-G formula based on Cr of 1.69).   Medical History: Past Medical History  Diagnosis Date  . Diabetes mellitus without complication   . Hypertension   . Dementia   . A-fib   . Chronic systolic CHF (congestive heart failure)   . WPW (Wolff-Parkinson-White syndrome)   . PAD (peripheral artery disease)   . Hypothyroidism   . CAD (coronary artery disease)   . Alzheimer disease   . Depression   . Hyperlipidemia     Medications:  Scheduled:  . amLODipine  5 mg Oral Daily  . docusate sodium  100 mg Oral BID  . donepezil  10 mg Oral q morning - 10a  . ferrous fumarate  1 tablet Oral See admin instructions  . insulin aspart  0-9 Units Subcutaneous TID WC  . insulin aspart protamine- aspart  15 Units Subcutaneous Q breakfast  . insulin aspart protamine- aspart  8 Units Subcutaneous Q supper  . levothyroxine  112 mcg Oral q morning - 10a  . memantine  10 mg Oral BID  . pantoprazole  40 mg Oral Daily  . sertraline  25 mg Oral q morning - 10a  . sevelamer carbonate  800 mg Oral BID WC  . simvastatin  20 mg Oral QHS  . torsemide  40 mg Oral q morning - 10a  . warfarin  6 mg Oral q1800  . Warfarin -  Pharmacist Dosing Inpatient   Does not apply q1800    Assessment: Patient is an 31 you male admitted for hip fracture.  Patient on chronic warfarin therapy for atrial fibrillation.  Home dosing for warfarin of 6 mg po daily.  INR on admission of 2.11.  Patient received Vitamin K 10 mg IV x 2.  Warfarin 6mg  resumed 6/10. INR 1.26 this AM.  Goal of Therapy:  INR 2-3 Monitor hemoglobin per policy   Plan:  INR remains subtherapeutic.  Will increase dose by ~15% to 7 mg po daily.  Of note, patient received a total of 20 mg of Vitamin K IV.  This may still have effects on the INR. Will recheck INR with AM labs.    Pharmacy to follow per consult  Clarisa Schools, PharmD Clinical Pharmacist 11/29/2014

## 2014-11-29 NOTE — Progress Notes (Signed)
Subjective:   patient is demented says unable to give much of a history. Family states that he is doing reasonably well no evidence of worsening pain of blackout spells of syncope.  Objective:  Vital Signs in the last 24 hours: Temp:  [97.5 F (36.4 C)-98.7 F (37.1 C)] 98.7 F (37.1 C) (06/13 0805) Pulse Rate:  [74-77] 75 (06/13 1051) Resp:  [16-18] 16 (06/13 0805) BP: (105-122)/(41-55) 105/41 mmHg (06/13 0805) SpO2:  [91 %-96 %] 95 % (06/13 1051)  Intake/Output from previous day: 06/12 0701 - 06/13 0700 In: 1068.2 [P.O.:840; I.V.:228.2] Out: 1450 [Urine:1450] Intake/Output from this shift:    Physical Exam: HEENT  Normal cephalic atraumatic.  Pupils equally reactive to NECK  Supple no significant JVD Lung  Clear passes percussion no significant rales or rhonchi HEART  Regular rhythm no significant murmur gallops or rubs ABD  Abdominal exam is benign positive bowel sounds of abundant sinus EXT  Hip pain status post surgery Neurol   grossly intact PSYCH  Demented severe  Lab Results:  Recent Labs  11/28/14 0502 11/29/14 0337  WBC 6.2 6.1  HGB 8.7* 8.4*  PLT 91* 105*    Recent Labs  11/27/14 0418 11/28/14 0502  NA 141 135  K 4.3 4.1  CL 105 105  CO2 27 26  GLUCOSE 149* 84  BUN 36* 42*  CREATININE 1.58* 1.69*   No results for input(s): TROPONINI in the last 72 hours.  Invalid input(s): CK, MB Hepatic Function Panel No results for input(s): PROT, ALBUMIN, AST, ALT, ALKPHOS, BILITOT, BILIDIR, IBILI in the last 72 hours. No results for input(s): CHOL in the last 72 hours. No results for input(s): PROTIME in the last 72 hours.  Imaging: Imaging results have been reviewed  Cardiac Studies:  Assessment/Plan:  Arrhythmia Atrial Fibrillation Cardiomyopathy CHF Palpitations Syncope Dementia   status post hip fracture  permanent pacemaker/ AICD . PLAN  recommend permanent pacemaker AICD investigation  Which showed no evidence of AICD discharge or  unusual arrhythmias  continue rehab from hips surgery  hypertension control  continue diabetes management with insulin  continue simvastatin for hyperlipidemia  continue dementia therapy  agree with levothyroxine for thyroid disease  continue Protonix for GERD symptoms  continue current conservative medical therapy    LOS: 4 days    Tim Myers D. 11/29/2014, 12:15 PM

## 2014-11-29 NOTE — Progress Notes (Signed)
PT Cancellation Note  Patient Details Name: LIND BARKMAN MRN: [id number] DOB: 07/04/1924   Cancelled Treatment:    Reason Eval/Treat Not Completed: Patient declined, no reason specified (Pt is discharging this pm to SNF)    Kristeen Miss 11/29/2014, 2:22 PM

## 2014-11-29 NOTE — Progress Notes (Signed)
Pt alert but confused at times, adm with right hip fx p/o 3 s/p orif of right hip, cardiology following pt, pt has pacemaker in LUC, on coumadin, 2L O2 [place], iv in right wrist asymptomatic with iv fluids inf well, pt had acute urinary retention this shft, bladder scan results >999, md made aware and ordered in and out cath, out, medicated for pain x 1, dressing changed x 3 d/t pt pulling off dressing, incision with staples intact and minimal drainage noted. Pt dc plan to go to snf

## 2014-11-29 NOTE — Clinical Social Work Note (Signed)
Patient has had several bed offers and patient and wife have chosen bed at [facility]. Kim at [place] has been made aware. Will facilitate discharge when time.  York Spaniel MSW,LCSWA [phone]

## 2014-11-29 NOTE — Clinical Social Work Placement (Signed)
   CLINICAL SOCIAL WORK PLACEMENT  NOTE  Date:  11/29/2014  Patient Details  Name: Tim Myers MRN: [id number] Date of Birth: 07/04/1924  Clinical Social Work is seeking post-discharge placement for this patient at the Skilled  Nursing Facility level of care (*CSW will initial, date and re-position this form in  chart as items are completed):  Yes   Patient/family provided with [facility] Clinical Social Work Department's list of facilities offering this level of care within the geographic area requested by the patient (or if unable, by the patient's family).  Yes   Patient/family informed of their freedom to choose among providers that offer the needed level of care, that participate in Medicare, Medicaid or managed care program needed by the patient, have an available bed and are willing to accept the patient.  Yes   Patient/family informed of [facility] ownership interest in [facility] and [facility], as well as of the fact that they are under no obligation to receive care at these facilities.  PASRR submitted to EDS on 11/27/14     PASRR number received on 11/27/14     Existing PASRR number confirmed on       FL2 transmitted to all facilities in geographic area requested by pt/family on 11/27/14     FL2 transmitted to all facilities within larger geographic area on       Patient informed that his/her managed care company has contracts with or will negotiate with certain facilities, including the following:        Yes   Patient/family informed of bed offers received.  Patient chooses bed at  [facility])     Physician recommends and patient chooses bed at  [facility])    Patient to be transferred to  [facility]) on 11/29/14.  Patient to be transferred to facility by  (EMS)     Patient family notified on 11/29/14 of transfer.  Name of family member notified:   (wife)     PHYSICIAN       Additional Comment:     _______________________________________________ York Spaniel, LCSW 11/29/2014, 1:39 PM

## 2014-11-29 NOTE — Progress Notes (Signed)
Transported by EMS to [facility] at this time  Wife at bedside

## 2014-11-29 NOTE — Progress Notes (Signed)
Pt discharged to [facility], saline lock d/c , report called to [place], pt to be transported by EMS

## 2014-11-29 NOTE — Progress Notes (Signed)
OT Cancellation Note  Patient Details Name: [name] MRN: [id number] DOB: 07/04/1924   Cancelled Treatment:     Attempted Occupational Therapy treatment. Patient being bathed. Will re-attempt time permitting.  Ocie Cornfield 11/29/2014, 10:15 AM

## 2014-11-29 NOTE — Progress Notes (Signed)
Occupational Therapy Treatment Patient Details Name: IRENE MITCHAM MRN: [id number] DOB: 07/04/1924 Today's Date: 11/29/2014    History of present illness 81 year old male who came to [facility] after a hip fracture, recieving an ORIF repair.   OT comments  Patient practiced techniques for Donned/doffed socks and pants to knees using hip kit. Used hand over hand assist to illustrate use.  Follow Up Recommendations       Equipment Recommendations       Recommendations for Other Services      Precautions / Restrictions Precautions Precautions: Fall Restrictions Weight Bearing Restrictions: Yes RLE Weight Bearing: Partial weight bearing       Mobility Bed Mobility                  Transfers                      Balance                                   ADL                       Lower Body Dressing: Maximal assistance (Practiced lower body dressing using hip kit)                        Vision                     Perception     Praxis      Cognition   Behavior During Therapy: Anxious                         Extremity/Trunk Assessment               Exercises     Shoulder Instructions       General Comments      Pertinent Vitals/ Pain          Home Living                                          Prior Functioning/Environment              Frequency       Progress Toward Goals  OT Goals(current goals can now be found in the care plan section)  Progress towards OT goals:  (Slow progression)     Plan      Co-evaluation                 End of Session Equipment Utilized During Treatment:  (hip kit)   Activity Tolerance     Patient Left     Nurse Communication          Time: 6659-9357 OT Time Calculation (min): 13 min  Charges: OT General Charges $OT Visit: 1 Procedure OT Treatments $Self Care/Home Management : 8-22 mins  Myrene Galas, MS/OTR/L  11/29/2014, 12:12 PM

## 2014-11-29 NOTE — Progress Notes (Signed)
Bladder scan 167 , notified Dr Esaw Grandchild of pt inability to void order received

## 2014-11-29 NOTE — Progress Notes (Signed)
  Subjective:  Patient is postop day #3 from intramedullary fixation of right intertrochanteric hip fracture. He is resting comfortably in bed. Patient reports pain as mild.  He is making appropriate progress with physical therapy. His vitals and labs values remained stable. Patient complains of mild to moderate pain in the right knee.  Objective:   VITALS:   Filed Vitals:   11/28/14 2016 11/29/14 0414 11/29/14 0805 11/29/14 1051  BP: 122/55 112/55 105/41   Pulse: 76 74 77 75  Temp: 97.5 F (36.4 C) 98.3 F (36.8 C) 98.7 F (37.1 C)   TempSrc: Oral Oral Oral   Resp: 18 18 16    Height:      Weight:      SpO2: 91% 95% 95% 95%   Right lower extremity: I personally changed patient's dressing today which was saturated with serosanguineous drainage. His sensation in the right lower extremity to light touch is improved today. He has intact motor function with palpable pedal pulses and no calf tenderness. Patient has a small right knee effusion. Neurovascular intact Sensation intact distally Intact pulses distally Dorsiflexion/Plantar flexion intact Incision: moderate drainage No cellulitis present Compartment soft  LABS  Results for orders placed or performed during the hospital encounter of 11/25/14 (from the past 24 hour(s))  Glucose, capillary     Status: Abnormal   Collection Time: 11/28/14  4:09 PM  Result Value Ref Range   Glucose-Capillary 160 (H) 65 - 99 mg/dL  Glucose, capillary     Status: Abnormal   Collection Time: 11/28/14  8:42 PM  Result Value Ref Range   Glucose-Capillary 159 (H) 65 - 99 mg/dL   Comment 1 Notify RN   CBC     Status: Abnormal   Collection Time: 11/29/14  3:37 AM  Result Value Ref Range   WBC 6.1 3.8 - 10.6 K/uL   RBC 2.88 (L) 4.40 - 5.90 MIL/uL   Hemoglobin 8.4 (L) 13.0 - 18.0 g/dL   HCT 09.3 (L) 11.2 - 16.2 %   MCV 87.4 80.0 - 100.0 fL   MCH 29.3 26.0 - 34.0 pg   MCHC 33.5 32.0 - 36.0 g/dL   RDW 44.6 (H) 95.0 - 72.2 %   Platelets 105 (L)  150 - 440 K/uL  Protime-INR     Status: Abnormal   Collection Time: 11/29/14  3:37 AM  Result Value Ref Range   Prothrombin Time 16.0 (H) 11.4 - 15.0 seconds   INR 1.26   Glucose, capillary     Status: Abnormal   Collection Time: 11/29/14  8:02 AM  Result Value Ref Range   Glucose-Capillary 128 (H) 65 - 99 mg/dL   Comment 1 Notify RN   Glucose, capillary     Status: Abnormal   Collection Time: 11/29/14 11:34 AM  Result Value Ref Range   Glucose-Capillary 157 (H) 65 - 99 mg/dL   Comment 1 Notify RN     No results found.  Assessment/Plan: 3 Days Post-Op   Active Problems:   Hip fracture requiring operative repair  Patient is doing well orthopedically. He may be discharged to rehabilitation from an orthopedic standpoint. I discussed this case with Dr. Desiree Hane who has cleared him from medical standpoint. He will continue physical and occupational therapy at rehabilitation and follow-up in my office in 10-14 days. Patient's right knee pain is likely due to flareup of underlying osteoarthritis. He retreated symptomatically.     Juanell Fairly , MD 11/29/2014, 12:56 PM

## 2014-11-29 NOTE — Consult Note (Signed)
Reason for Consult:Syncope/ fractured hip/ history of permanent pacemaker Referring Physician:  Orthopedics Dr Walker Kehr is an 81 y.o. male.  HPI:  81 year old demented male with a history of permanent pacemaker  Presented with syncope fractured hip from a fall is not clear for the past out got dizzy had symptoms because of his dementia history is impossible. Because of his history of permanent pacemaker cardiology is recommended for evaluation of pacemaker AICD evaluation. Patient has history of cardiomyopathy atrial fibrillation hyperlipidemia GERD diabetes hypertension and dementia. No clear evidence of ASD discharge no malfunction of the pacemaker but Cardiology was recommended for Further workup and treatment could be done if symptoms persist, worsen or new related symptoms . Reportedly patient had a history of Wolff-Parkinson-White status post cardiomyopathy with AICD and pacemaker placement at [facility]. Patient is unable year history about the electrical shocks but pacemaker interrogation is recommended.  Past Medical History  Diagnosis Date  . Diabetes mellitus without complication   . Hypertension   . Dementia   . A-fib   . Chronic systolic CHF (congestive heart failure)   . WPW (Wolff-Parkinson-White syndrome)   . PAD (peripheral artery disease)   . Hypothyroidism   . CAD (coronary artery disease)   . Alzheimer disease   . Depression   . Hyperlipidemia     Past Surgical History  Procedure Laterality Date  . Coronary artery bypass graft    . Abdominal aortic aneurysm repair    . Pacemaker placement      defib/pacemaker  . Implantable cardioverter defibrillator implant    . Femur im nail Right 11/26/2014    Procedure: INTRAMEDULLARY (IM) NAIL FEMORAL;  Surgeon: Thornton Park, MD;  Location: [facility] ORS;  Service: Orthopedics;  Laterality: Right;  femur    History reviewed. No pertinent family history.  Social History:  reports that he has quit smoking. He does not  have any smokeless tobacco history on file. He reports that he does not drink alcohol or use illicit drugs.  Allergies:  Allergies  Allergen Reactions  . Ambien [Zolpidem Tartrate]   . Erythromycin Other (See Comments)    Reaction:  Unknown   . Penicillins Swelling    Medications:  Prior to Admission:  Prescriptions prior to admission  Medication Sig Dispense Refill Last Dose  . cyanocobalamin (,VITAMIN B-12,) 1000 MCG/ML injection Inject 1,000 mcg into the muscle every 30 (thirty) days.   unknown at unknown  . docusate sodium (COLACE) 100 MG capsule Take 300 mg by mouth at bedtime.   11/24/2014 at Unknown time  . donepezil (ARICEPT) 10 MG tablet Take 10 mg by mouth every morning.   11/25/2014 at Unknown time  . ferrous fumarate-iron polysaccharide complex (TANDEM) 162-115.2 MG CAPS Take 1 capsule by mouth See admin instructions. Pt takes on Monday, Wednesday, and Friday.   11/24/2014 at Unknown time  . insulin NPH-regular Human (HUMULIN 70/30) (70-30) 100 UNIT/ML injection Inject 10-15 Units into the skin 2 (two) times daily with a meal. Pt uses 15 units with breakfast and 10 units with dinner.   11/25/2014 at Unknown time  . isosorbide dinitrate (ISORDIL) 10 MG tablet Take 10 mg by mouth 2 (two) times daily.   11/25/2014 at Unknown time  . levothyroxine (SYNTHROID, LEVOTHROID) 112 MCG tablet Take 112 mcg by mouth every morning.   11/25/2014 at Unknown time  . memantine (NAMENDA) 10 MG tablet Take 10 mg by mouth 2 (two) times daily.    11/25/2014 at Unknown time  .  Multiple Vitamins-Minerals (PRESERVISION AREDS PO) Take 1 capsule by mouth 2 (two) times daily.    11/25/2014 at Unknown time  . omeprazole (PRILOSEC) 20 MG capsule Take 20 mg by mouth 2 (two) times daily.    11/25/2014 at Unknown time  . sertraline (ZOLOFT) 25 MG tablet Take 25 mg by mouth every morning.   11/25/2014 at Unknown time  . sevelamer carbonate (RENVELA) 800 MG tablet Take 800 mg by mouth 2 (two) times daily.   11/25/2014 at Unknown time   . simvastatin (ZOCOR) 40 MG tablet Take 20 mg by mouth at bedtime.   11/24/2014 at Unknown time  . spironolactone (ALDACTONE) 25 MG tablet Take 12.5 mg by mouth every morning.   11/25/2014 at Unknown time  . torsemide (DEMADEX) 20 MG tablet Take 40 mg by mouth every morning.   11/25/2014 at Unknown time  . traMADol (ULTRAM) 50 MG tablet Take 50 mg by mouth every 6 (six) hours as needed for moderate pain.   Past Month at Unknown time  . warfarin (COUMADIN) 6 MG tablet Take 6 mg by mouth at bedtime.   11/24/2014 at Unknown time    Results for orders placed or performed during the hospital encounter of 11/25/14 (from the past 48 hour(s))  Glucose, capillary     Status: Abnormal   Collection Time: 11/27/14 12:46 PM  Result Value Ref Range   Glucose-Capillary 139 (H) 65 - 99 mg/dL  Glucose, capillary     Status: None   Collection Time: 11/27/14  4:03 PM  Result Value Ref Range   Glucose-Capillary 86 65 - 99 mg/dL   Comment 1 Notify RN   Glucose, capillary     Status: None   Collection Time: 11/27/14  8:58 PM  Result Value Ref Range   Glucose-Capillary 96 65 - 99 mg/dL  CBC     Status: Abnormal   Collection Time: 11/28/14  5:02 AM  Result Value Ref Range   WBC 6.2 3.8 - 10.6 K/uL   RBC 2.98 (L) 4.40 - 5.90 MIL/uL   Hemoglobin 8.7 (L) 13.0 - 18.0 g/dL   HCT 26.0 (L) 40.0 - 52.0 %   MCV 87.0 80.0 - 100.0 fL   MCH 29.2 26.0 - 34.0 pg   MCHC 33.6 32.0 - 36.0 g/dL   RDW 16.2 (H) 11.5 - 14.5 %   Platelets 91 (L) 150 - 440 K/uL  Basic metabolic panel     Status: Abnormal   Collection Time: 11/28/14  5:02 AM  Result Value Ref Range   Sodium 135 135 - 145 mmol/L   Potassium 4.1 3.5 - 5.1 mmol/L   Chloride 105 101 - 111 mmol/L   CO2 26 22 - 32 mmol/L   Glucose, Bld 84 65 - 99 mg/dL   BUN 42 (H) 6 - 20 mg/dL   Creatinine, Ser 1.69 (H) 0.61 - 1.24 mg/dL   Calcium 7.9 (L) 8.9 - 10.3 mg/dL   GFR calc non Af Amer 34 (L) >60 mL/min   GFR calc Af Amer 40 (L) >60 mL/min    Comment: (NOTE) The eGFR  has been calculated using the CKD EPI equation. This calculation has not been validated in all clinical situations. eGFR's persistently <60 mL/min signify possible Chronic Kidney Disease.    Anion gap 4 (L) 5 - 15  Protime-INR     Status: Abnormal   Collection Time: 11/28/14  5:02 AM  Result Value Ref Range   Prothrombin Time 16.0 (H) 11.4 - 15.0 seconds  INR 1.26   Glucose, capillary     Status: None   Collection Time: 11/28/14  7:48 AM  Result Value Ref Range   Glucose-Capillary 88 65 - 99 mg/dL  Glucose, capillary     Status: Abnormal   Collection Time: 11/28/14 12:28 PM  Result Value Ref Range   Glucose-Capillary 112 (H) 65 - 99 mg/dL  Glucose, capillary     Status: Abnormal   Collection Time: 11/28/14  4:09 PM  Result Value Ref Range   Glucose-Capillary 160 (H) 65 - 99 mg/dL  Glucose, capillary     Status: Abnormal   Collection Time: 11/28/14  8:42 PM  Result Value Ref Range   Glucose-Capillary 159 (H) 65 - 99 mg/dL   Comment 1 Notify RN   CBC     Status: Abnormal   Collection Time: 11/29/14  3:37 AM  Result Value Ref Range   WBC 6.1 3.8 - 10.6 K/uL   RBC 2.88 (L) 4.40 - 5.90 MIL/uL   Hemoglobin 8.4 (L) 13.0 - 18.0 g/dL   HCT 25.1 (L) 40.0 - 52.0 %   MCV 87.4 80.0 - 100.0 fL   MCH 29.3 26.0 - 34.0 pg   MCHC 33.5 32.0 - 36.0 g/dL   RDW 16.3 (H) 11.5 - 14.5 %   Platelets 105 (L) 150 - 440 K/uL  Protime-INR     Status: Abnormal   Collection Time: 11/29/14  3:37 AM  Result Value Ref Range   Prothrombin Time 16.0 (H) 11.4 - 15.0 seconds   INR 1.26   Glucose, capillary     Status: Abnormal   Collection Time: 11/29/14  8:02 AM  Result Value Ref Range   Glucose-Capillary 128 (H) 65 - 99 mg/dL   Comment 1 Notify RN     No results found.  Review of Systems  Unable to perform ROS HENT: Negative.   Skin: Negative.   Neurological: Positive for weakness.   Blood pressure 105/41, pulse 77, temperature 98.7 F (37.1 C), temperature source Oral, resp. rate 16, height  5' 9"  (1.753 m), weight 73.71 kg (162 lb 8 oz), SpO2 95 %. Physical Exam  Vitals reviewed. Constitutional: He appears well-developed and well-nourished. He appears lethargic.  HENT:  Head: Normocephalic.  Right Ear: External ear normal.  Eyes: Conjunctivae and EOM are normal. Pupils are equal, round, and reactive to light.  Neck: Normal range of motion. Neck supple.  Cardiovascular:  Murmur heard. Respiratory: Effort normal and breath sounds normal.  GI: Soft. Bowel sounds are normal.  Musculoskeletal: Normal range of motion. He exhibits tenderness.  Neurological: He appears lethargic. He is disoriented.   Severe dementia  Skin: Skin is warm and dry.  Psychiatric:   Severe dementia no behavioral disturbances    Assessment/Plan:  syncope  fractured hip  hypertension  diabetes  dementia  atrial fibrillation  hyperlipidemia  History of WPW  sick sinus syndrome  status post permanent pacemaker placement/ AICD  GERD . PLAN  agree with hip surgery  proceed with AICD permanent pacemaker interrogation  continue blood pressure control  recommend short-term anticoagulation postop  I would be concerned about long-term anticoagulation for AFib because of his risk of falls  continue lipid management  agree with levothyroxine for thyroid disease  GERD therapy with Protonix should be continued  continue insulin for diabetes management and support  recommend conservative cardiology management at this point  we will arrange AICD permanent pacemaker placement in the morning   Natanael Saladin D. 11/29/2014, 10:49 AM

## 2014-11-29 NOTE — Discharge Summary (Signed)
[facility] - [place] at [facility]   PATIENT NAME: Tim Myers    MR#:  [id number]  DATE OF BIRTH:  07/04/1924  DATE OF ADMISSION:  11/25/2014 ADMITTING PHYSICIAN: Ramonita Lab, MD  DATE OF DISCHARGE: 11/29/2014  PRIMARY CARE PHYSICIAN: Lauro Regulus., MD    ADMISSION DIAGNOSIS:  Right hip pain [M25.551] Hip fracture, right, closed, initial encounter [S72.001A]  DISCHARGE DIAGNOSIS:  Active Problems:   Hip fracture requiring operative repair   Urinary retention   Anemia due to blood loss.  SECONDARY DIAGNOSIS:   Past Medical History  Diagnosis Date  . Diabetes mellitus without complication   . Hypertension   . Dementia   . A-fib   . Chronic systolic CHF (congestive heart failure)   . WPW (Wolff-Parkinson-White syndrome)   . PAD (peripheral artery disease)   . Hypothyroidism   . CAD (coronary artery disease)   . Alzheimer disease   . Depression   . Hyperlipidemia     HOSPITAL COURSE:  * Right intertrochanteric fracture from fall Surgery on 11/26/14 Pain management. PT consult. need SNF at discharge.likely d./c tomorrow.  * Acute blood loss anemia from fracture Monitor. Transfuse PRN per ortho.  * Persistent Afib On coumadin. Vitamin K given prior to surgery. Restarted coumadin. pharmacy to manage dose. Unexplained fall- will get cardiology to help with defibrillator.  being discharged on lovenox dose as INR is subtherapeutic.   Need to follow INR in 3-4 days.  * chronic systolic congestive heart failure - EF 25% Continue torsemide. No IVF after surgery. encouraged to use incentive spirometry.  * history of diabetes mellitus Continue insulin human 70/30 15 units a.m. and 10 units at dinnertime Continue sliding scale insulin  * history of coronary artery disease status post CABG  * history of dementia-continue Namenda  * history of hypertension- add amlodipin.  * urinary retention   Did not had spontaneous  urination, started on flomax- and d/c with advise to have in and out cath at rehab after bladder scan  DISCHARGE CONDITIONS:   Stable.  CONSULTS OBTAINED:  Treatment Team:  Juanell Fairly, MD Lamar Blinks, MD Alwyn Pea, MD  DRUG ALLERGIES:   Allergies  Allergen Reactions  . Ambien [Zolpidem Tartrate]   . Erythromycin Other (See Comments)    Reaction:  Unknown   . Penicillins Swelling    DISCHARGE MEDICATIONS:   Current Discharge Medication List    START taking these medications   Details  alum & mag hydroxide-simeth (MAALOX/MYLANTA) 200-200-20 MG/5ML suspension Take 30 mLs by mouth every 4 (four) hours as needed for indigestion. Qty: 355 mL, Refills: 0    amLODipine (NORVASC) 5 MG tablet Take 1 tablet (5 mg total) by mouth daily. Qty: 30 tablet, Refills: 0    bisacodyl (DULCOLAX) 10 MG suppository Place 1 suppository (10 mg total) rectally daily as needed for moderate constipation. Qty: 12 suppository, Refills: 0    enoxaparin (LOVENOX) 30 MG/0.3ML injection Inject 0.3 mLs (30 mg total) into the skin every 12 (twelve) hours. Qty: 3 mL, Refills: 0    menthol-cetylpyridinium (CEPACOL) 3 MG lozenge Take 1 lozenge (3 mg total) by mouth as needed for sore throat (sore throat). Qty: 100 tablet, Refills: 12    oxyCODONE (OXY IR/ROXICODONE) 5 MG immediate release tablet Take 1-2 tablets (5-10 mg total) by mouth every 4 (four) hours as needed for breakthrough pain ((for MODERATE breakthrough pain)). Qty: 20 tablet, Refills: 0    tamsulosin (FLOMAX) 0.4 MG CAPS capsule Take  1 capsule (0.4 mg total) by mouth daily. Qty: 20 capsule, Refills: 0      CONTINUE these medications which have CHANGED   Details  traMADol (ULTRAM) 50 MG tablet Take 1 tablet (50 mg total) by mouth every 6 (six) hours as needed for moderate pain. Qty: 20 tablet, Refills: 0      CONTINUE these medications which have NOT CHANGED   Details  cyanocobalamin (,VITAMIN B-12,) 1000 MCG/ML  injection Inject 1,000 mcg into the muscle every 30 (thirty) days.    docusate sodium (COLACE) 100 MG capsule Take 300 mg by mouth at bedtime.    donepezil (ARICEPT) 10 MG tablet Take 10 mg by mouth every morning.    ferrous fumarate-iron polysaccharide complex (TANDEM) 162-115.2 MG CAPS Take 1 capsule by mouth See admin instructions. Pt takes on Monday, Wednesday, and Friday.    insulin NPH-regular Human (HUMULIN 70/30) (70-30) 100 UNIT/ML injection Inject 10-15 Units into the skin 2 (two) times daily with a meal. Pt uses 15 units with breakfast and 10 units with dinner.    isosorbide dinitrate (ISORDIL) 10 MG tablet Take 10 mg by mouth 2 (two) times daily.    levothyroxine (SYNTHROID, LEVOTHROID) 112 MCG tablet Take 112 mcg by mouth every morning.    memantine (NAMENDA) 10 MG tablet Take 10 mg by mouth 2 (two) times daily.     omeprazole (PRILOSEC) 20 MG capsule Take 20 mg by mouth 2 (two) times daily.     sertraline (ZOLOFT) 25 MG tablet Take 25 mg by mouth every morning.    sevelamer carbonate (RENVELA) 800 MG tablet Take 800 mg by mouth 2 (two) times daily.    simvastatin (ZOCOR) 40 MG tablet Take 20 mg by mouth at bedtime.    spironolactone (ALDACTONE) 25 MG tablet Take 12.5 mg by mouth every morning.    torsemide (DEMADEX) 20 MG tablet Take 40 mg by mouth every morning.    warfarin (COUMADIN) 6 MG tablet Take 6 mg by mouth at bedtime.      STOP taking these medications     Multiple Vitamins-Minerals (PRESERVISION AREDS PO)          DISCHARGE INSTRUCTIONS:    Check INR in 3-4 days. Check bladder scan for residual urine every 8 hrs- if > 300 ml- do in and out catheter, if retention does not improve in 3 days- need urology consult. Follow with [facility] orthopedics- in 2 weeks.  If you experience worsening of your admission symptoms, develop shortness of breath, life threatening emergency, suicidal or homicidal thoughts you must seek medical attention immediately by calling  911 or calling your MD immediately  if symptoms less severe.  You Must read complete instructions/literature along with all the possible adverse reactions/side effects for all the Medicines you take and that have been prescribed to you. Take any new Medicines after you have completely understood and accept all the possible adverse reactions/side effects.   Please note  You were cared for by a hospitalist during your hospital stay. If you have any questions about your discharge medications or the care you received while you were in the hospital after you are discharged, you can call the unit and asked to speak with the hospitalist on call if the hospitalist that took care of you is not available. Once you are discharged, your primary care physician will handle any further medical issues. Please note that NO REFILLS for any discharge medications will be authorized once you are discharged, as it is imperative  that you return to your primary care physician (or establish a relationship with a primary care physician if you do not have one) for your aftercare needs so that they can reassess your need for medications and monitor your lab values.    Today   CHIEF COMPLAINT:   Chief Complaint  Patient presents with  . Fall    HISTORY OF PRESENT ILLNESS:  Tim Myers  is a 81 y.o. male with a known history of dementia, hypertension and diabetes mellitus has sustained a fall today.It was unclear at what time patient fell as he was found on the living room floor by his son  X-ray has revealed right hip fracture. Patient takes Coumadin and his INR is at 2.11 in the ED. ED physician has notified on call orthopedics doctor Dr. Martha Clan regarding the admission  VITAL SIGNS:  Blood pressure 105/41, pulse 75, temperature 98.7 F (37.1 C), temperature source Oral, resp. rate 16, height  (1.753 m), weight 73.71 kg (162 lb 8 oz), SpO2 95 %.  I/O:   Intake/Output Summary (Last 24 hours) at 11/29/14  1218 Last data filed at 11/29/14 1039  Gross per 24 hour  Intake 708.17 ml  Output   1150 ml  Net -441.83 ml    PHYSICAL EXAMINATION:  GENERAL: 81 y.o.-year-old patient lying in the bed with no acute distress.  EYES: Pupils equal, round, reactive to light and accommodation. No scleral icterus. Extraocular muscles intact.  HEENT: Head atraumatic, normocephalic. Oropharynx and nasopharynx clear.  NECK: Supple, no jugular venous distention. No thyroid enlargement, no tenderness.  LUNGS: Normal breath sounds bilaterally, no wheezing, rales, rhonchi. No use of accessory muscles of respiration.  CARDIOVASCULAR: S1, S2 normal. No murmurs, rubs, or gallops.  ABDOMEN: Soft, nontender, nondistended. Bowel sounds present. No organomegaly or mass.  EXTREMITIES: No cyanosis, clubbing or edema b/l. Tender right hip NEUROLOGIC: Cranial nerves II through XII are intact. No focal Motor or sensory deficits b/l.  PSYCHIATRIC: The patient is alert and oriented x 3.  SKIN: No obvious rash, lesion, or ulcer.   DATA REVIEW:   CBC  Recent Labs Lab 11/29/14 0337  WBC 6.1  HGB 8.4*  HCT 25.1*  PLT 105*    Chemistries   Recent Labs Lab 11/28/14 0502  NA 135  K 4.1  CL 105  CO2 26  GLUCOSE 84  BUN 42*  CREATININE 1.69*  CALCIUM 7.9*    Cardiac Enzymes No results for input(s): TROPONINI in the last 168 hours.  Microbiology Results  No results found for this or any previous visit.  RADIOLOGY:  No results found.   Management plans discussed with the patient, family and they are in agreement.  CODE STATUS:     Code Status Orders        Start     Ordered   11/26/14 1531  Full code   Continuous     11/26/14 1530    Advance Directive Documentation        Most Recent Value   Type of Advance Directive  Healthcare Power of Attorney, Living will   Pre-existing out of facility DNR order (yellow form or pink MOST form)     "MOST" Form in Place?        TOTAL TIME  TAKING CARE OF THIS PATIENT: 40 minutes.    Altamese Dilling M.D on 11/29/2014 at 12:18 PM  Between 7am to 6pm - Pager - [phone]  After 6pm go to [email or web address] - password [email or web address] [facility]  [place] [facility]  Office  [phone]  CC: Primary care physician; Lauro Regulus., MD

## 2014-11-30 LAB — URINALYSIS COMPLETE WITH MICROSCOPIC (ARMC ONLY)
BILIRUBIN URINE: NEGATIVE
Bacteria, UA: NONE SEEN
Glucose, UA: NEGATIVE mg/dL
Hgb urine dipstick: NEGATIVE
Ketones, ur: NEGATIVE mg/dL
LEUKOCYTES UA: NEGATIVE
NITRITE: NEGATIVE
Protein, ur: NEGATIVE mg/dL
RBC / HPF: NONE SEEN RBC/hpf (ref 0–5)
SQUAMOUS EPITHELIAL / LPF: NONE SEEN
Specific Gravity, Urine: 1.01 (ref 1.005–1.030)
pH: 5 (ref 5.0–8.0)

## 2014-11-30 MED FILL — Insulin Aspart Prot & Aspart (Human) Inj 100 Unit/ML (70-30): SUBCUTANEOUS | Qty: 0.08 | Status: AC

## 2014-11-30 MED FILL — Insulin Aspart Prot & Aspart (Human) Inj 100 Unit/ML (70-30): SUBCUTANEOUS | Qty: 0.15 | Status: AC

## 2014-12-02 LAB — CBC WITH DIFFERENTIAL/PLATELET
BASOS ABS: 0 10*3/uL (ref 0–0.1)
BASOS PCT: 0 %
EOS ABS: 0.1 10*3/uL (ref 0–0.7)
EOS PCT: 2 %
HEMATOCRIT: 24.7 % — AB (ref 40.0–52.0)
HEMOGLOBIN: 8.1 g/dL — AB (ref 13.0–18.0)
Lymphocytes Relative: 7 %
Lymphs Abs: 0.4 10*3/uL — ABNORMAL LOW (ref 1.0–3.6)
MCH: 29 pg (ref 26.0–34.0)
MCHC: 32.8 g/dL (ref 32.0–36.0)
MCV: 88.6 fL (ref 80.0–100.0)
MONO ABS: 0.7 10*3/uL (ref 0.2–1.0)
Monocytes Relative: 13 %
Neutro Abs: 4 10*3/uL (ref 1.4–6.5)
Neutrophils Relative %: 78 %
Platelets: 143 10*3/uL — ABNORMAL LOW (ref 150–440)
RBC: 2.78 MIL/uL — AB (ref 4.40–5.90)
RDW: 16 % — ABNORMAL HIGH (ref 11.5–14.5)
WBC: 5.1 10*3/uL (ref 3.8–10.6)

## 2014-12-02 LAB — PROTIME-INR
INR: 2.86
Prothrombin Time: 30.1 seconds — ABNORMAL HIGH (ref 11.4–15.0)

## 2014-12-05 ENCOUNTER — Other Ambulatory Visit
Admission: RE | Admit: 2014-12-05 | Discharge: 2014-12-05 | Disposition: A | Discharge status: Home / Self Care | Payer: Medicare Other | Source: Skilled Nursing Facility | Attending: Internal Medicine | Admitting: Internal Medicine

## 2014-12-05 DIAGNOSIS — R8290 Unspecified abnormal findings in urine: Secondary | ICD-10-CM | POA: Insufficient documentation

## 2014-12-05 LAB — URINALYSIS COMPLETE WITH MICROSCOPIC (ARMC ONLY)
BILIRUBIN URINE: NEGATIVE
GLUCOSE, UA: NEGATIVE mg/dL
Ketones, ur: NEGATIVE mg/dL
Nitrite: NEGATIVE
Protein, ur: NEGATIVE mg/dL
Specific Gravity, Urine: 1.011 (ref 1.005–1.030)
pH: 5 (ref 5.0–8.0)

## 2014-12-06 LAB — GLUCOSE, CAPILLARY: Glucose-Capillary: 218 mg/dL — ABNORMAL HIGH (ref 65–99)

## 2014-12-07 LAB — URINE CULTURE: Culture: >=100000

## 2014-12-08 LAB — GLUCOSE, CAPILLARY: Glucose-Capillary: 167 mg/dL — ABNORMAL HIGH (ref 65–99)

## 2014-12-09 LAB — COMPREHENSIVE METABOLIC PANEL
ALT: 11 U/L — ABNORMAL LOW (ref 17–63)
ANION GAP: 10 (ref 5–15)
AST: 20 U/L (ref 15–41)
Albumin: 2.7 g/dL — ABNORMAL LOW (ref 3.5–5.0)
Alkaline Phosphatase: 82 U/L (ref 38–126)
BUN: 74 mg/dL — AB (ref 6–20)
CO2: 29 mmol/L (ref 22–32)
CREATININE: 1.82 mg/dL — AB (ref 0.61–1.24)
Calcium: 8.6 mg/dL — ABNORMAL LOW (ref 8.9–10.3)
Chloride: 99 mmol/L — ABNORMAL LOW (ref 101–111)
GFR calc non Af Amer: 32 mL/min — ABNORMAL LOW (ref >60)
GFR, EST AFRICAN AMERICAN: 37 mL/min — AB (ref >60)
Glucose, Bld: 141 mg/dL — ABNORMAL HIGH (ref 65–99)
Potassium: 4.2 mmol/L (ref 3.5–5.1)
SODIUM: 138 mmol/L (ref 135–145)
TOTAL PROTEIN: 6.2 g/dL — AB (ref 6.5–8.1)
Total Bilirubin: 0.7 mg/dL (ref 0.3–1.2)

## 2014-12-09 LAB — PROTIME-INR
INR: 4.82
Prothrombin Time: 44.9 seconds — ABNORMAL HIGH (ref 11.4–15.0)

## 2014-12-09 LAB — CBC WITH DIFFERENTIAL/PLATELET
BASOS ABS: 0 10*3/uL (ref 0–0.1)
Eosinophils Absolute: 0.1 10*3/uL (ref 0–0.7)
Eosinophils Relative: 2 %
HCT: 26.2 % — ABNORMAL LOW (ref 40.0–52.0)
Hemoglobin: 8.5 g/dL — ABNORMAL LOW (ref 13.0–18.0)
Lymphs Abs: 0.6 10*3/uL — ABNORMAL LOW (ref 1.0–3.6)
MCH: 28.5 pg (ref 26.0–34.0)
MCHC: 32.3 g/dL (ref 32.0–36.0)
MCV: 88.5 fL (ref 80.0–100.0)
MONO ABS: 0.6 10*3/uL (ref 0.2–1.0)
Monocytes Relative: 9 %
Neutro Abs: 5.4 10*3/uL (ref 1.4–6.5)
Neutrophils Relative %: 79 %
Platelets: 265 10*3/uL (ref 150–440)
RBC: 2.96 MIL/uL — ABNORMAL LOW (ref 4.40–5.90)
RDW: 17.1 % — AB (ref 11.5–14.5)
WBC: 6.8 10*3/uL (ref 3.8–10.6)

## 2014-12-09 LAB — MAGNESIUM: Magnesium: 2.7 mg/dL — ABNORMAL HIGH (ref 1.7–2.4)

## 2014-12-09 LAB — TSH: TSH: 3.114 u[IU]/mL (ref 0.350–4.500)

## 2014-12-09 LAB — VITAMIN B12: Vitamin B-12: 1547 pg/mL — ABNORMAL HIGH (ref 180–914)

## 2014-12-11 LAB — PROTIME-INR
INR: 3.37
Prothrombin Time: 34.1 seconds — ABNORMAL HIGH (ref 11.4–15.0)

## 2014-12-13 LAB — PROTIME-INR
INR: 3.03
PROTHROMBIN TIME: 31.4 s — AB (ref 11.4–15.0)

## 2014-12-14 LAB — CBC WITH DIFFERENTIAL/PLATELET
BASOS ABS: 0 10*3/uL (ref 0–0.1)
Basophils Relative: 1 %
EOS PCT: 2 %
Eosinophils Absolute: 0.1 10*3/uL (ref 0–0.7)
HEMATOCRIT: 26.3 % — AB (ref 40.0–52.0)
Hemoglobin: 8.4 g/dL — ABNORMAL LOW (ref 13.0–18.0)
LYMPHS PCT: 8 %
Lymphs Abs: 0.4 10*3/uL — ABNORMAL LOW (ref 1.0–3.6)
MCH: 27.7 pg (ref 26.0–34.0)
MCHC: 31.7 g/dL — ABNORMAL LOW (ref 32.0–36.0)
MCV: 87.4 fL (ref 80.0–100.0)
Monocytes Absolute: 0.5 10*3/uL (ref 0.2–1.0)
Monocytes Relative: 9 %
Neutro Abs: 4.4 10*3/uL (ref 1.4–6.5)
Neutrophils Relative %: 80 %
PLATELETS: 244 10*3/uL (ref 150–440)
RBC: 3.02 MIL/uL — AB (ref 4.40–5.90)
RDW: 17.1 % — ABNORMAL HIGH (ref 11.5–14.5)
WBC: 5.5 10*3/uL (ref 3.8–10.6)

## 2014-12-14 LAB — COMPREHENSIVE METABOLIC PANEL
ALT: 9 U/L — ABNORMAL LOW (ref 17–63)
AST: 19 U/L (ref 15–41)
Albumin: 2.6 g/dL — ABNORMAL LOW (ref 3.5–5.0)
Alkaline Phosphatase: 82 U/L (ref 38–126)
Anion gap: 9 (ref 5–15)
BILIRUBIN TOTAL: 0.7 mg/dL (ref 0.3–1.2)
BUN: 57 mg/dL — ABNORMAL HIGH (ref 6–20)
CO2: 27 mmol/L (ref 22–32)
Calcium: 8.6 mg/dL — ABNORMAL LOW (ref 8.9–10.3)
Chloride: 100 mmol/L — ABNORMAL LOW (ref 101–111)
Creatinine, Ser: 1.72 mg/dL — ABNORMAL HIGH (ref 0.61–1.24)
GFR calc Af Amer: 39 mL/min — ABNORMAL LOW (ref >60)
GFR calc non Af Amer: 34 mL/min — ABNORMAL LOW (ref >60)
GLUCOSE: 108 mg/dL — AB (ref 65–99)
Potassium: 4.5 mmol/L (ref 3.5–5.1)
Sodium: 136 mmol/L (ref 135–145)
TOTAL PROTEIN: 5.9 g/dL — AB (ref 6.5–8.1)

## 2014-12-14 LAB — PROTIME-INR
INR: 3.25
Prothrombin Time: 33.2 seconds — ABNORMAL HIGH (ref 11.4–15.0)

## 2014-12-14 LAB — VITAMIN D 1,25 DIHYDROXY
VITAMIN D 1, 25 (OH) TOTAL: 58 pg/mL
Vitamin D3 1, 25 (OH)2: 57 pg/mL

## 2014-12-17 ENCOUNTER — Encounter
Admission: RE | Admit: 2014-12-17 | Discharge: 2014-12-17 | Disposition: A | Discharge status: Home / Self Care | Payer: Medicare Other | Source: Ambulatory Visit | Attending: Internal Medicine | Admitting: Internal Medicine

## 2014-12-17 DIAGNOSIS — E039 Hypothyroidism, unspecified: Secondary | ICD-10-CM | POA: Insufficient documentation

## 2014-12-17 DIAGNOSIS — R350 Frequency of micturition: Secondary | ICD-10-CM | POA: Insufficient documentation

## 2014-12-17 DIAGNOSIS — R410 Disorientation, unspecified: Secondary | ICD-10-CM | POA: Insufficient documentation

## 2014-12-17 DIAGNOSIS — D649 Anemia, unspecified: Secondary | ICD-10-CM | POA: Insufficient documentation

## 2014-12-17 DIAGNOSIS — E1140 Type 2 diabetes mellitus with diabetic neuropathy, unspecified: Secondary | ICD-10-CM | POA: Insufficient documentation

## 2014-12-17 DIAGNOSIS — I4891 Unspecified atrial fibrillation: Secondary | ICD-10-CM | POA: Insufficient documentation

## 2014-12-17 LAB — PROTIME-INR
INR: 2.11
PROTHROMBIN TIME: 23.8 s — AB (ref 11.4–15.0)

## 2014-12-21 LAB — PROTIME-INR
INR: 2.18
Prothrombin Time: 24.4 seconds — ABNORMAL HIGH (ref 11.4–15.0)

## 2014-12-28 LAB — URINALYSIS COMPLETE WITH MICROSCOPIC (ARMC ONLY)
BACTERIA UA: NONE SEEN
BILIRUBIN URINE: NEGATIVE
Glucose, UA: NEGATIVE mg/dL
HGB URINE DIPSTICK: NEGATIVE
Ketones, ur: NEGATIVE mg/dL
Nitrite: NEGATIVE
PROTEIN: NEGATIVE mg/dL
Specific Gravity, Urine: 1.017 (ref 1.005–1.030)
Squamous Epithelial / LPF: NONE SEEN
pH: 7 (ref 5.0–8.0)

## 2014-12-28 LAB — PROTIME-INR
INR: 2.44
Prothrombin Time: 26.6 seconds — ABNORMAL HIGH (ref 11.4–15.0)

## 2014-12-30 LAB — COMPREHENSIVE METABOLIC PANEL
ALT: 9 U/L — AB (ref 17–63)
AST: 20 U/L (ref 15–41)
Albumin: 3.2 g/dL — ABNORMAL LOW (ref 3.5–5.0)
Alkaline Phosphatase: 67 U/L (ref 38–126)
Anion gap: 11 (ref 5–15)
BILIRUBIN TOTAL: 0.5 mg/dL (ref 0.3–1.2)
BUN: 50 mg/dL — ABNORMAL HIGH (ref 6–20)
CALCIUM: 8.9 mg/dL (ref 8.9–10.3)
CO2: 30 mmol/L (ref 22–32)
Chloride: 95 mmol/L — ABNORMAL LOW (ref 101–111)
Creatinine, Ser: 1.45 mg/dL — ABNORMAL HIGH (ref 0.61–1.24)
GFR calc Af Amer: 48 mL/min — ABNORMAL LOW (ref >60)
GFR calc non Af Amer: 41 mL/min — ABNORMAL LOW (ref >60)
Glucose, Bld: 143 mg/dL — ABNORMAL HIGH (ref 65–99)
POTASSIUM: 4.3 mmol/L (ref 3.5–5.1)
SODIUM: 136 mmol/L (ref 135–145)
TOTAL PROTEIN: 6.7 g/dL (ref 6.5–8.1)

## 2014-12-30 LAB — CBC WITH DIFFERENTIAL/PLATELET
BASOS ABS: 0 10*3/uL (ref 0–0.1)
BASOS PCT: 1 %
EOS ABS: 0.1 10*3/uL (ref 0–0.7)
Eosinophils Relative: 3 %
HEMATOCRIT: 31.1 % — AB (ref 40.0–52.0)
Hemoglobin: 9.8 g/dL — ABNORMAL LOW (ref 13.0–18.0)
LYMPHS ABS: 0.7 10*3/uL — AB (ref 1.0–3.6)
Lymphocytes Relative: 18 %
MCH: 27.4 pg (ref 26.0–34.0)
MCHC: 31.5 g/dL — ABNORMAL LOW (ref 32.0–36.0)
MCV: 87 fL (ref 80.0–100.0)
MONOS PCT: 13 %
Monocytes Absolute: 0.5 10*3/uL (ref 0.2–1.0)
NEUTROS ABS: 2.6 10*3/uL (ref 1.4–6.5)
NEUTROS PCT: 65 %
Platelets: 152 10*3/uL (ref 150–440)
RBC: 3.57 MIL/uL — ABNORMAL LOW (ref 4.40–5.90)
RDW: 17.3 % — ABNORMAL HIGH (ref 11.5–14.5)
WBC: 4 10*3/uL (ref 3.8–10.6)

## 2014-12-30 LAB — URINALYSIS COMPLETE WITH MICROSCOPIC (ARMC ONLY)
BILIRUBIN URINE: NEGATIVE
Bacteria, UA: NONE SEEN
Glucose, UA: NEGATIVE mg/dL
HGB URINE DIPSTICK: NEGATIVE
Ketones, ur: NEGATIVE mg/dL
Nitrite: NEGATIVE
PH: 7 (ref 5.0–8.0)
PROTEIN: NEGATIVE mg/dL
SPECIFIC GRAVITY, URINE: 1.017 (ref 1.005–1.030)
SQUAMOUS EPITHELIAL / LPF: NONE SEEN

## 2014-12-30 LAB — URINE CULTURE: Culture: NO GROWTH

## 2015-01-01 LAB — URINE CULTURE: Culture: NO GROWTH

## 2015-01-04 LAB — CBC WITH DIFFERENTIAL/PLATELET
BASOS ABS: 0 10*3/uL (ref 0–0.1)
Basophils Relative: 1 %
EOS ABS: 0.1 10*3/uL (ref 0–0.7)
Eosinophils Relative: 2 %
HCT: 30 % — ABNORMAL LOW (ref 40.0–52.0)
Hemoglobin: 9.4 g/dL — ABNORMAL LOW (ref 13.0–18.0)
Lymphocytes Relative: 13 %
Lymphs Abs: 0.7 10*3/uL — ABNORMAL LOW (ref 1.0–3.6)
MCH: 27.1 pg (ref 26.0–34.0)
MCHC: 31.4 g/dL — ABNORMAL LOW (ref 32.0–36.0)
MCV: 86.1 fL (ref 80.0–100.0)
Monocytes Absolute: 0.6 10*3/uL (ref 0.2–1.0)
Monocytes Relative: 11 %
Neutro Abs: 3.7 10*3/uL (ref 1.4–6.5)
Neutrophils Relative %: 73 %
PLATELETS: 146 10*3/uL — AB (ref 150–440)
RBC: 3.49 MIL/uL — ABNORMAL LOW (ref 4.40–5.90)
RDW: 16.9 % — AB (ref 11.5–14.5)
WBC: 5 10*3/uL (ref 3.8–10.6)

## 2015-01-04 LAB — PROTIME-INR
INR: 2.7
Prothrombin Time: 28.8 seconds — ABNORMAL HIGH (ref 11.4–15.0)

## 2015-01-04 LAB — COMPREHENSIVE METABOLIC PANEL
ALK PHOS: 67 U/L (ref 38–126)
ALT: 11 U/L — ABNORMAL LOW (ref 17–63)
ANION GAP: 11 (ref 5–15)
AST: 20 U/L (ref 15–41)
Albumin: 3.2 g/dL — ABNORMAL LOW (ref 3.5–5.0)
BILIRUBIN TOTAL: 0.5 mg/dL (ref 0.3–1.2)
BUN: 55 mg/dL — AB (ref 6–20)
CO2: 29 mmol/L (ref 22–32)
Calcium: 8.9 mg/dL (ref 8.9–10.3)
Chloride: 97 mmol/L — ABNORMAL LOW (ref 101–111)
Creatinine, Ser: 1.52 mg/dL — ABNORMAL HIGH (ref 0.61–1.24)
GFR calc non Af Amer: 39 mL/min — ABNORMAL LOW (ref >60)
GFR, EST AFRICAN AMERICAN: 45 mL/min — AB (ref >60)
Glucose, Bld: 134 mg/dL — ABNORMAL HIGH (ref 65–99)
POTASSIUM: 3.9 mmol/L (ref 3.5–5.1)
Sodium: 137 mmol/L (ref 135–145)
Total Protein: 6.7 g/dL (ref 6.5–8.1)

## 2015-01-08 ENCOUNTER — Other Ambulatory Visit
Admission: RE | Admit: 2015-01-08 | Discharge: 2015-01-08 | Disposition: A | Discharge status: Home / Self Care | Payer: No Typology Code available for payment source | Source: Ambulatory Visit | Attending: Internal Medicine | Admitting: Internal Medicine

## 2015-01-08 DIAGNOSIS — I481 Persistent atrial fibrillation: Secondary | ICD-10-CM | POA: Insufficient documentation

## 2015-01-08 LAB — URINALYSIS COMPLETE WITH MICROSCOPIC (ARMC ONLY)
BILIRUBIN URINE: NEGATIVE
Glucose, UA: NEGATIVE mg/dL
KETONES UR: NEGATIVE mg/dL
NITRITE: NEGATIVE
PH: 5 (ref 5.0–8.0)
Protein, ur: NEGATIVE mg/dL
Specific Gravity, Urine: 1.012 (ref 1.005–1.030)

## 2015-01-10 ENCOUNTER — Other Ambulatory Visit: Payer: Self-pay | Admitting: Gerontology

## 2015-01-10 DIAGNOSIS — M25551 Pain in right hip: Secondary | ICD-10-CM

## 2015-01-11 LAB — BASIC METABOLIC PANEL
Anion gap: 11 (ref 5–15)
BUN: 85 mg/dL — ABNORMAL HIGH (ref 6–20)
CO2: 27 mmol/L (ref 22–32)
Calcium: 8.2 mg/dL — ABNORMAL LOW (ref 8.9–10.3)
Chloride: 98 mmol/L — ABNORMAL LOW (ref 101–111)
Creatinine, Ser: 2.84 mg/dL — ABNORMAL HIGH (ref 0.61–1.24)
GFR, EST AFRICAN AMERICAN: 21 mL/min — AB (ref >60)
GFR, EST NON AFRICAN AMERICAN: 18 mL/min — AB (ref >60)
Glucose, Bld: 151 mg/dL — ABNORMAL HIGH (ref 65–99)
POTASSIUM: 4.9 mmol/L (ref 3.5–5.1)
SODIUM: 136 mmol/L (ref 135–145)

## 2015-01-11 LAB — GENTAMICIN LEVEL, PEAK: Gentamicin Pk: 5.8 ug/mL (ref 5.0–10.0)

## 2015-01-11 LAB — PROTIME-INR
INR: 3.93
PROTHROMBIN TIME: 38.4 s — AB (ref 11.4–15.0)

## 2015-01-12 LAB — CBC WITH DIFFERENTIAL/PLATELET
BASOS ABS: 0 10*3/uL (ref 0–0.1)
BASOS PCT: 0 %
Eosinophils Absolute: 0.1 10*3/uL (ref 0–0.7)
Eosinophils Relative: 2 %
HEMATOCRIT: 28.4 % — AB (ref 40.0–52.0)
Hemoglobin: 9 g/dL — ABNORMAL LOW (ref 13.0–18.0)
Lymphocytes Relative: 5 %
Lymphs Abs: 0.4 10*3/uL — ABNORMAL LOW (ref 1.0–3.6)
MCH: 26.9 pg (ref 26.0–34.0)
MCHC: 31.8 g/dL — ABNORMAL LOW (ref 32.0–36.0)
MCV: 84.4 fL (ref 80.0–100.0)
Monocytes Absolute: 0.7 10*3/uL (ref 0.2–1.0)
Monocytes Relative: 10 %
Neutro Abs: 5.6 10*3/uL (ref 1.4–6.5)
Neutrophils Relative %: 83 %
Platelets: 113 10*3/uL — ABNORMAL LOW (ref 150–440)
RBC: 3.36 MIL/uL — ABNORMAL LOW (ref 4.40–5.90)
RDW: 16.6 % — ABNORMAL HIGH (ref 11.5–14.5)
WBC: 6.8 10*3/uL (ref 3.8–10.6)

## 2015-01-12 LAB — COMPREHENSIVE METABOLIC PANEL
ALT: 12 U/L — AB (ref 17–63)
ANION GAP: 9 (ref 5–15)
AST: 23 U/L (ref 15–41)
Albumin: 2.7 g/dL — ABNORMAL LOW (ref 3.5–5.0)
Alkaline Phosphatase: 65 U/L (ref 38–126)
BUN: 88 mg/dL — AB (ref 6–20)
CALCIUM: 8.2 mg/dL — AB (ref 8.9–10.3)
CO2: 28 mmol/L (ref 22–32)
Chloride: 102 mmol/L (ref 101–111)
Creatinine, Ser: 2.81 mg/dL — ABNORMAL HIGH (ref 0.61–1.24)
GFR calc Af Amer: 22 mL/min — ABNORMAL LOW (ref >60)
GFR, EST NON AFRICAN AMERICAN: 19 mL/min — AB (ref >60)
GLUCOSE: 69 mg/dL (ref 65–99)
Potassium: 4.1 mmol/L (ref 3.5–5.1)
SODIUM: 139 mmol/L (ref 135–145)
Total Bilirubin: 0.6 mg/dL (ref 0.3–1.2)
Total Protein: 6.1 g/dL — ABNORMAL LOW (ref 6.5–8.1)

## 2015-01-12 LAB — URINE CULTURE: Culture: >=100000

## 2015-01-12 LAB — GENTAMICIN LEVEL, RANDOM: Gentamicin Rm: 6.6 ug/mL

## 2015-01-12 LAB — GENTAMICIN LEVEL, TROUGH: GENTAMICIN TR: 4.9 ug/mL — AB (ref 0.5–2.0)

## 2015-01-12 LAB — GENTAMICIN LEVEL, PEAK: GENTAMICIN PK: 11.2 ug/mL — AB (ref 5.0–10.0)

## 2015-01-13 LAB — COMPREHENSIVE METABOLIC PANEL
ALBUMIN: 2.6 g/dL — AB (ref 3.5–5.0)
ALK PHOS: 63 U/L (ref 38–126)
ALT: 12 U/L — AB (ref 17–63)
ANION GAP: 13 (ref 5–15)
AST: 22 U/L (ref 15–41)
BUN: 88 mg/dL — AB (ref 6–20)
CO2: 26 mmol/L (ref 22–32)
CREATININE: 2.68 mg/dL — AB (ref 0.61–1.24)
Calcium: 8.3 mg/dL — ABNORMAL LOW (ref 8.9–10.3)
Chloride: 102 mmol/L (ref 101–111)
GFR calc Af Amer: 23 mL/min — ABNORMAL LOW (ref >60)
GFR, EST NON AFRICAN AMERICAN: 20 mL/min — AB (ref >60)
GLUCOSE: 109 mg/dL — AB (ref 65–99)
Potassium: 4.2 mmol/L (ref 3.5–5.1)
SODIUM: 141 mmol/L (ref 135–145)
Total Bilirubin: 0.7 mg/dL (ref 0.3–1.2)
Total Protein: 5.9 g/dL — ABNORMAL LOW (ref 6.5–8.1)

## 2015-01-13 LAB — CBC WITH DIFFERENTIAL/PLATELET
BASOS ABS: 0 10*3/uL (ref 0–0.1)
Basophils Relative: 0 %
EOS ABS: 0.1 10*3/uL (ref 0–0.7)
Eosinophils Relative: 1 %
HCT: 29 % — ABNORMAL LOW (ref 40.0–52.0)
HEMOGLOBIN: 9.2 g/dL — AB (ref 13.0–18.0)
LYMPHS PCT: 6 %
Lymphs Abs: 0.4 10*3/uL — ABNORMAL LOW (ref 1.0–3.6)
MCH: 26.7 pg (ref 26.0–34.0)
MCHC: 31.8 g/dL — AB (ref 32.0–36.0)
MCV: 83.9 fL (ref 80.0–100.0)
Monocytes Absolute: 0.6 10*3/uL (ref 0.2–1.0)
Monocytes Relative: 10 %
NEUTROS PCT: 83 %
Neutro Abs: 5.3 10*3/uL (ref 1.4–6.5)
PLATELETS: 113 10*3/uL — AB (ref 150–440)
RBC: 3.46 MIL/uL — ABNORMAL LOW (ref 4.40–5.90)
RDW: 16.7 % — ABNORMAL HIGH (ref 11.5–14.5)
WBC: 6.4 10*3/uL (ref 3.8–10.6)

## 2015-01-13 LAB — PROTIME-INR
INR: 5.01 — AB
Prothrombin Time: 46.3 seconds — ABNORMAL HIGH (ref 11.4–15.0)

## 2015-01-13 LAB — GENTAMICIN LEVEL, TROUGH: Gentamicin Trough: 5.4 ug/mL (ref 0.5–2.0)

## 2015-01-14 LAB — PROTIME-INR
INR: 3.11
PROTHROMBIN TIME: 32.1 s — AB (ref 11.4–15.0)

## 2015-01-17 ENCOUNTER — Encounter
Admission: RE | Admit: 2015-01-17 | Discharge: 2015-01-17 | Disposition: A | Discharge status: Home / Self Care | Payer: Medicare Other | Source: Ambulatory Visit | Attending: Internal Medicine | Admitting: Internal Medicine

## 2015-01-18 LAB — URINALYSIS COMPLETE WITH MICROSCOPIC (ARMC ONLY)
Bacteria, UA: NONE SEEN
Bilirubin Urine: NEGATIVE
Glucose, UA: NEGATIVE mg/dL
Hgb urine dipstick: NEGATIVE
KETONES UR: NEGATIVE mg/dL
NITRITE: NEGATIVE
Protein, ur: NEGATIVE mg/dL
SQUAMOUS EPITHELIAL / LPF: NONE SEEN
Specific Gravity, Urine: 1.015 (ref 1.005–1.030)
pH: 8 (ref 5.0–8.0)

## 2015-01-18 LAB — PROTIME-INR
INR: 2.08
Prothrombin Time: 23.5 seconds — ABNORMAL HIGH (ref 11.4–15.0)

## 2015-01-20 LAB — URINE CULTURE: CULTURE: NO GROWTH

## 2015-01-25 LAB — PROTIME-INR
INR: 2.28
Prothrombin Time: 25.3 seconds — ABNORMAL HIGH (ref 11.4–15.0)

## 2015-02-03 ENCOUNTER — Ambulatory Visit: Payer: Self-pay | Admitting: Urology

## 2015-02-04 ENCOUNTER — Ambulatory Visit (INDEPENDENT_AMBULATORY_CARE_PROVIDER_SITE_OTHER): Payer: Medicare Other | Admitting: Urology

## 2015-02-04 ENCOUNTER — Encounter: Payer: Self-pay | Admitting: Urology

## 2015-02-04 VITALS — BP 119/69 | HR 71 | Ht 69.0 in

## 2015-02-04 DIAGNOSIS — R339 Retention of urine, unspecified: Secondary | ICD-10-CM | POA: Diagnosis not present

## 2015-02-04 MED ORDER — LIDOCAINE HCL 2 % EX GEL
1.0000 "application " | Freq: Once | CUTANEOUS | Status: AC
  Administered 2015-02-04: 1 via URETHRAL

## 2015-02-04 NOTE — Progress Notes (Signed)
Cath Change/ Replacement  Patient is present today for a catheter change due to urinary retention.  10ml of water was removed from the balloon, a 14FR foley cath was removed with out difficulty.  Patient was cleaned and prepped in a sterile fashion with betadine and 2% lidocaine jelly was instilled into the urethra. A 16 FR foley cath was replaced into the bladder no complications were noted Urine return was noted 10ml and urine was clear and yellow in color. The balloon was filled with 10ml of sterile water. A night bag was attached for drainage.  A night bag was also given to the patient and patient was given instruction on how to change from one bag to another. Patient was given proper instruction on catheter care.    Preformed by: Suezanne Cheshire

## 2015-02-04 NOTE — Progress Notes (Signed)
02/04/2015 3:11 PM   Tim Myers May 22, 1924 [id number]  Referring provider: Lauro Regulus, MD [address] [place], [place] [address]  Chief Complaint  Patient presents with  . Urinary Retention    concerned about cath    HPI: Patient is an 81 year old white male who presents today with his wife for further evaluation and management of urinary retention.  Patient is very lethargic at the time of this visit and is unable to answer questions. I obtained this history from the wife.  Patient suffered a fall on 11/22/2014. He on underwent hip replacement on 11/26/2014. A catheter was placed after the procedure due to the patient's inability to urinate. He was then discharged to [facility] for rehabilitation. He failed a voiding trial at their facility. He was started on Flomax a few days prior to his visit today. Patient's wife states that he has not had any urological history. Prior to his hip replacement he was only experiencing a slowing of the urinary stream.  Patient's wife denies any history of recurrent urinary tract infections, kidney stones or prostate procedures.  She also denies any gross hematuria patient complaining of catheter discomfort. The patient has not been experiencing fevers, chills, nausea or vomiting.   PMH: Past Medical History  Diagnosis Date  . Diabetes mellitus without complication   . Hypertension   . Dementia   . A-fib   . Chronic systolic CHF (congestive heart failure)   . WPW (Wolff-Parkinson-White syndrome)   . PAD (peripheral artery disease)   . Hypothyroidism   . CAD (coronary artery disease)   . Alzheimer disease   . Depression   . Hyperlipidemia   . Pneumonia   . Wheezing   . Gait abnormality   . Weakness   . GERD (gastroesophageal reflux disease)   . Chronic kidney disease   . Enlarged prostate   . Iron deficiency anemia   . Vitamin D deficiency     Surgical History: Past Surgical History  Procedure Laterality Date  .  Coronary artery bypass graft    . Abdominal aortic aneurysm repair    . Pacemaker placement      defib/pacemaker  . Implantable cardioverter defibrillator implant    . Femur im nail Right 11/26/2014    Procedure: INTRAMEDULLARY (IM) NAIL FEMORAL;  Surgeon: Juanell Fairly, MD;  Location: [facility] ORS;  Service: Orthopedics;  Laterality: Right;  femur    Home Medications:    Medication List       This list is accurate as of: 02/04/15 11:59 PM.  Always use your most recent med list.               acetaminophen 325 MG tablet  Commonly known as:  TYLENOL  Take 650 mg by mouth every 4 (four) hours as needed.     alum & mag hydroxide-simeth 200-200-20 MG/5ML suspension  Commonly known as:  MAALOX/MYLANTA  Take 30 mLs by mouth every 4 (four) hours as needed for indigestion.     amLODipine 5 MG tablet  Commonly known as:  NORVASC  Take 1 tablet (5 mg total) by mouth daily.     bisacodyl 10 MG suppository  Commonly known as:  DULCOLAX  Place 1 suppository (10 mg total) rectally daily as needed for moderate constipation.     cyanocobalamin 1000 MCG/ML injection  Commonly known as:  (VITAMIN B-12)  Inject 1,000 mcg into the muscle every 30 (thirty) days.     docusate sodium 100 MG capsule  Commonly known as:  COLACE  Take 300 mg by mouth at bedtime.     donepezil 10 MG tablet  Commonly known as:  ARICEPT  Take 10 mg by mouth every morning.     enoxaparin 30 MG/0.3ML injection  Commonly known as:  LOVENOX  Inject 0.3 mLs (30 mg total) into the skin every 12 (twelve) hours.     ferrous fumarate-iron polysaccharide complex 162-115.2 MG Caps  Commonly known as:  TANDEM  Take 1 capsule by mouth See admin instructions. Pt takes on Monday, Wednesday, and Friday.     HUMULIN 70/30 (70-30) 100 UNIT/ML injection  Generic drug:  insulin NPH-regular Human  Inject 10-15 Units into the skin 2 (two) times daily with a meal. Pt uses 15 units with breakfast and 10 units with dinner.      isosorbide dinitrate 10 MG tablet  Commonly known as:  ISORDIL  Take 10 mg by mouth 2 (two) times daily.     levothyroxine 112 MCG tablet  Commonly known as:  SYNTHROID, LEVOTHROID  Take 112 mcg by mouth every morning.     memantine 10 MG tablet  Commonly known as:  NAMENDA  Take 10 mg by mouth 2 (two) times daily.     menthol-cetylpyridinium 3 MG lozenge  Commonly known as:  CEPACOL  Take 1 lozenge (3 mg total) by mouth as needed for sore throat (sore throat).     omeprazole 20 MG capsule  Commonly known as:  PRILOSEC  Take 20 mg by mouth 2 (two) times daily.     oxyCODONE 5 MG immediate release tablet  Commonly known as:  Oxy IR/ROXICODONE  Take 1-2 tablets (5-10 mg total) by mouth every 4 (four) hours as needed for breakthrough pain ((for MODERATE breakthrough pain)).     PRESERVISION AREDS PO  Take by mouth.     RENAGEL 800 MG tablet  Generic drug:  sevelamer  Take by mouth.     SENNA PLUS 8.6-50 MG per tablet  Generic drug:  senna-docusate  Take 1 tablet by mouth daily.     sertraline 25 MG tablet  Commonly known as:  ZOLOFT  Take 25 mg by mouth every morning.     sevelamer carbonate 800 MG tablet  Commonly known as:  RENVELA  Take 800 mg by mouth 2 (two) times daily.     simvastatin 40 MG tablet  Commonly known as:  ZOCOR  Take 20 mg by mouth at bedtime.     spironolactone 25 MG tablet  Commonly known as:  ALDACTONE  Take 12.5 mg by mouth every morning.     sulfamethoxazole-trimethoprim 800-160 MG per tablet  Commonly known as:  BACTRIM DS,SEPTRA DS  Take 1 tablet by mouth 2 (two) times daily.     tamsulosin 0.4 MG Caps capsule  Commonly known as:  FLOMAX  Take 1 capsule (0.4 mg total) by mouth daily.     torsemide 20 MG tablet  Commonly known as:  DEMADEX  Take 40 mg by mouth every morning.     traMADol 50 MG tablet  Commonly known as:  ULTRAM  Take 1 tablet (50 mg total) by mouth every 6 (six) hours as needed for moderate pain.     traZODone  100 MG tablet  Commonly known as:  DESYREL  Take by mouth.     Vitamin D3 2000 UNITS Tabs  Take 2,000 Units by mouth.     warfarin 6 MG tablet  Commonly known as:  COUMADIN  Take 6 mg by mouth at bedtime.     warfarin 2.5 MG tablet  Commonly known as:  COUMADIN  Take 2.5 mg by mouth daily.     warfarin 1 MG tablet  Commonly known as:  COUMADIN  Take 1 mg by mouth daily.        Allergies:  Allergies  Allergen Reactions  . Ambien [Zolpidem Tartrate]   . Erythromycin Other (See Comments)    Reaction:  Unknown   . Penicillins Swelling    Family History: Family History  Problem Relation Age of Onset  . Kidney disease Neg Hx   . Prostate cancer Neg Hx   . Bladder Cancer Neg Hx   . Hypertension Other     Social History:  reports that he has quit smoking. He does not have any smokeless tobacco history on file. He reports that he does not drink alcohol or use illicit drugs.  ROS: UROLOGY Frequent Urination?: No Hard to postpone urination?: No Burning/pain with urination?: No Get up at night to urinate?: No Leakage of urine?: No Urine stream starts and stops?: No Trouble starting stream?: No Do you have to strain to urinate?: No Blood in urine?: No Urinary tract infection?: No Sexually transmitted disease?: No Injury to kidneys or bladder?: No Painful intercourse?: No Weak stream?: Yes Erection problems?: Yes Penile pain?: No  Gastrointestinal Nausea?: No Vomiting?: No Indigestion/heartburn?: No Diarrhea?: No Constipation?: Yes  Constitutional Fever: No Night sweats?: No Weight loss?: Yes Fatigue?: Yes  Skin Skin rash/lesions?: No Itching?: No  Eyes Blurred vision?: Yes Double vision?: No  Ears/Nose/Throat Sore throat?: No Sinus problems?: No  Hematologic/Lymphatic Swollen glands?: No Easy bruising?: Yes  Cardiovascular Leg swelling?: Yes Chest pain?: No  Respiratory Cough?: No Shortness of breath?: No  Endocrine Excessive  thirst?: No  Musculoskeletal Back pain?: Yes Joint pain?: Yes  Neurological Headaches?: No Dizziness?: No  Psychologic Depression?: No Anxiety?: No  Physical Exam: BP 119/69 mmHg  Pulse 71  Ht 5\' 9"  (1.753 m)  Constitutional:  Alert and oriented, No acute distress. HEENT: [place] AT, moist mucus membranes.  Trachea midline, no masses. Cardiovascular: No clubbing, cyanosis, or edema. Respiratory: Normal respiratory effort, no increased work of breathing. GI: Abdomen is soft, nontender, nondistended, no abdominal masses GU: No CVA tenderness. GU: Foley in place. Rectal: Patient with  normal sphincter tone. Perineum without scarring or rashes. No rectal masses are appreciated. Prostate is approximately 30 grams, irregular. Seminal vesicles are normal. Skin: No rashes, bruises or suspicious lesions. Lymph: No cervical or inguinal adenopathy. Neurologic: Grossly intact, no focal deficits, moving all 4 extremities. Psychiatric: Normal mood and affect.  Assessment & Plan:    1. Urinary retention:      Patient has failed one voiding trial and he is nonambulatory at this time.  He is also very lethargic.  We will continue an indwelling Foley at this time. He will return in 1 month's time for catheter exchange and reassessment of his ambulatory and mental status at that time. He will continue the tamsulosin 0.4 mg daily.      There are no diagnoses linked to this encounter.  Return in about 1 month (around 03/07/2015) for Catheter removal in the am.  Michiel Cowboy, [facility]  [facility] [address] [place], [place] [address] [phone]

## 2015-02-09 ENCOUNTER — Inpatient Hospital Stay
Admission: EM | Admit: 2015-02-09 | Discharge: 2015-02-13 | DRG: 291 | Payer: Medicare Other | Attending: Internal Medicine | Admitting: Internal Medicine

## 2015-02-09 ENCOUNTER — Emergency Department: Payer: Medicare Other

## 2015-02-09 DIAGNOSIS — G309 Alzheimer's disease, unspecified: Secondary | ICD-10-CM | POA: Diagnosis present

## 2015-02-09 DIAGNOSIS — Z7901 Long term (current) use of anticoagulants: Secondary | ICD-10-CM

## 2015-02-09 DIAGNOSIS — I5023 Acute on chronic systolic (congestive) heart failure: Secondary | ICD-10-CM | POA: Diagnosis not present

## 2015-02-09 DIAGNOSIS — N189 Chronic kidney disease, unspecified: Secondary | ICD-10-CM

## 2015-02-09 DIAGNOSIS — I739 Peripheral vascular disease, unspecified: Secondary | ICD-10-CM | POA: Diagnosis present

## 2015-02-09 DIAGNOSIS — F0280 Dementia in other diseases classified elsewhere without behavioral disturbance: Secondary | ICD-10-CM | POA: Diagnosis present

## 2015-02-09 DIAGNOSIS — Z951 Presence of aortocoronary bypass graft: Secondary | ICD-10-CM

## 2015-02-09 DIAGNOSIS — L97419 Non-pressure chronic ulcer of right heel and midfoot with unspecified severity: Secondary | ICD-10-CM | POA: Diagnosis present

## 2015-02-09 DIAGNOSIS — Z8679 Personal history of other diseases of the circulatory system: Secondary | ICD-10-CM

## 2015-02-09 DIAGNOSIS — E039 Hypothyroidism, unspecified: Secondary | ICD-10-CM | POA: Diagnosis present

## 2015-02-09 DIAGNOSIS — J810 Acute pulmonary edema: Secondary | ICD-10-CM

## 2015-02-09 DIAGNOSIS — Z841 Family history of disorders of kidney and ureter: Secondary | ICD-10-CM

## 2015-02-09 DIAGNOSIS — Z880 Allergy status to penicillin: Secondary | ICD-10-CM

## 2015-02-09 DIAGNOSIS — E875 Hyperkalemia: Secondary | ICD-10-CM | POA: Diagnosis present

## 2015-02-09 DIAGNOSIS — N183 Chronic kidney disease, stage 3 (moderate): Secondary | ICD-10-CM | POA: Diagnosis present

## 2015-02-09 DIAGNOSIS — I2510 Atherosclerotic heart disease of native coronary artery without angina pectoris: Secondary | ICD-10-CM | POA: Diagnosis present

## 2015-02-09 DIAGNOSIS — I482 Chronic atrial fibrillation: Secondary | ICD-10-CM | POA: Diagnosis present

## 2015-02-09 DIAGNOSIS — E43 Unspecified severe protein-calorie malnutrition: Secondary | ICD-10-CM | POA: Diagnosis present

## 2015-02-09 DIAGNOSIS — I456 Pre-excitation syndrome: Secondary | ICD-10-CM | POA: Diagnosis present

## 2015-02-09 DIAGNOSIS — Z95810 Presence of automatic (implantable) cardiac defibrillator: Secondary | ICD-10-CM

## 2015-02-09 DIAGNOSIS — Z888 Allergy status to other drugs, medicaments and biological substances status: Secondary | ICD-10-CM

## 2015-02-09 DIAGNOSIS — Z66 Do not resuscitate: Secondary | ICD-10-CM | POA: Diagnosis present

## 2015-02-09 DIAGNOSIS — L97429 Non-pressure chronic ulcer of left heel and midfoot with unspecified severity: Secondary | ICD-10-CM | POA: Diagnosis present

## 2015-02-09 DIAGNOSIS — Z6828 Body mass index (BMI) 28.0-28.9, adult: Secondary | ICD-10-CM

## 2015-02-09 DIAGNOSIS — Z8042 Family history of malignant neoplasm of prostate: Secondary | ICD-10-CM

## 2015-02-09 DIAGNOSIS — K219 Gastro-esophageal reflux disease without esophagitis: Secondary | ICD-10-CM | POA: Diagnosis present

## 2015-02-09 DIAGNOSIS — N289 Disorder of kidney and ureter, unspecified: Secondary | ICD-10-CM

## 2015-02-09 DIAGNOSIS — J9601 Acute respiratory failure with hypoxia: Secondary | ICD-10-CM

## 2015-02-09 DIAGNOSIS — Z79899 Other long term (current) drug therapy: Secondary | ICD-10-CM

## 2015-02-09 DIAGNOSIS — H9190 Unspecified hearing loss, unspecified ear: Secondary | ICD-10-CM | POA: Diagnosis present

## 2015-02-09 DIAGNOSIS — Z8249 Family history of ischemic heart disease and other diseases of the circulatory system: Secondary | ICD-10-CM

## 2015-02-09 DIAGNOSIS — Z87891 Personal history of nicotine dependence: Secondary | ICD-10-CM

## 2015-02-09 DIAGNOSIS — E119 Type 2 diabetes mellitus without complications: Secondary | ICD-10-CM | POA: Diagnosis present

## 2015-02-09 DIAGNOSIS — N179 Acute kidney failure, unspecified: Secondary | ICD-10-CM | POA: Diagnosis present

## 2015-02-09 DIAGNOSIS — Z794 Long term (current) use of insulin: Secondary | ICD-10-CM

## 2015-02-09 DIAGNOSIS — I130 Hypertensive heart and chronic kidney disease with heart failure and stage 1 through stage 4 chronic kidney disease, or unspecified chronic kidney disease: Secondary | ICD-10-CM | POA: Diagnosis present

## 2015-02-09 LAB — CBC
HCT: 29.8 % — ABNORMAL LOW (ref 40.0–52.0)
HEMOGLOBIN: 9.2 g/dL — AB (ref 13.0–18.0)
MCH: 25.5 pg — AB (ref 26.0–34.0)
MCHC: 31 g/dL — ABNORMAL LOW (ref 32.0–36.0)
MCV: 82.2 fL (ref 80.0–100.0)
PLATELETS: 147 10*3/uL — AB (ref 150–440)
RBC: 3.62 MIL/uL — AB (ref 4.40–5.90)
RDW: 18.1 % — ABNORMAL HIGH (ref 11.5–14.5)
WBC: 9.2 10*3/uL (ref 3.8–10.6)

## 2015-02-09 LAB — BASIC METABOLIC PANEL
ANION GAP: 13 (ref 5–15)
BUN: 84 mg/dL — ABNORMAL HIGH (ref 6–20)
CALCIUM: 7.6 mg/dL — AB (ref 8.9–10.3)
CO2: 21 mmol/L — ABNORMAL LOW (ref 22–32)
Chloride: 103 mmol/L (ref 101–111)
Creatinine, Ser: 4.79 mg/dL — ABNORMAL HIGH (ref 0.61–1.24)
GFR calc non Af Amer: 10 mL/min — ABNORMAL LOW (ref >60)
GFR, EST AFRICAN AMERICAN: 11 mL/min — AB (ref >60)
Glucose, Bld: 130 mg/dL — ABNORMAL HIGH (ref 65–99)
Potassium: 5.2 mmol/L — ABNORMAL HIGH (ref 3.5–5.1)
SODIUM: 137 mmol/L (ref 135–145)

## 2015-02-09 MED ORDER — ALBUTEROL SULFATE (2.5 MG/3ML) 0.083% IN NEBU
5.0000 mg | INHALATION_SOLUTION | Freq: Once | RESPIRATORY_TRACT | Status: AC
  Administered 2015-02-09: 5 mg via RESPIRATORY_TRACT
  Filled 2015-02-09: qty 6

## 2015-02-09 MED ORDER — IPRATROPIUM-ALBUTEROL 0.5-2.5 (3) MG/3ML IN SOLN
3.0000 mL | Freq: Once | RESPIRATORY_TRACT | Status: AC
  Administered 2015-02-10: 3 mL via RESPIRATORY_TRACT
  Filled 2015-02-09: qty 3

## 2015-02-09 NOTE — ED Notes (Addendum)
Pt brought via EMS from [facility] w/ c/o SOB.  Per EMS, pt has hx of COPD and per facility normally sats in 80's.  Pt sts he does not feel any worse than normal.  Pt has audible wheezes.  Pt 92% on 4 L. Pt received albuterol tx by EMS.

## 2015-02-10 ENCOUNTER — Encounter: Payer: Self-pay | Admitting: Internal Medicine

## 2015-02-10 DIAGNOSIS — Z8042 Family history of malignant neoplasm of prostate: Secondary | ICD-10-CM | POA: Diagnosis not present

## 2015-02-10 DIAGNOSIS — Z79899 Other long term (current) drug therapy: Secondary | ICD-10-CM | POA: Diagnosis not present

## 2015-02-10 DIAGNOSIS — K219 Gastro-esophageal reflux disease without esophagitis: Secondary | ICD-10-CM | POA: Diagnosis present

## 2015-02-10 DIAGNOSIS — N183 Chronic kidney disease, stage 3 (moderate): Secondary | ICD-10-CM | POA: Diagnosis present

## 2015-02-10 DIAGNOSIS — E875 Hyperkalemia: Secondary | ICD-10-CM | POA: Diagnosis present

## 2015-02-10 DIAGNOSIS — Z87891 Personal history of nicotine dependence: Secondary | ICD-10-CM | POA: Diagnosis not present

## 2015-02-10 DIAGNOSIS — L97429 Non-pressure chronic ulcer of left heel and midfoot with unspecified severity: Secondary | ICD-10-CM | POA: Diagnosis present

## 2015-02-10 DIAGNOSIS — Z794 Long term (current) use of insulin: Secondary | ICD-10-CM | POA: Diagnosis not present

## 2015-02-10 DIAGNOSIS — Z66 Do not resuscitate: Secondary | ICD-10-CM | POA: Diagnosis present

## 2015-02-10 DIAGNOSIS — J810 Acute pulmonary edema: Secondary | ICD-10-CM | POA: Diagnosis present

## 2015-02-10 DIAGNOSIS — F0280 Dementia in other diseases classified elsewhere without behavioral disturbance: Secondary | ICD-10-CM | POA: Diagnosis present

## 2015-02-10 DIAGNOSIS — I5023 Acute on chronic systolic (congestive) heart failure: Secondary | ICD-10-CM | POA: Diagnosis present

## 2015-02-10 DIAGNOSIS — Z8679 Personal history of other diseases of the circulatory system: Secondary | ICD-10-CM | POA: Diagnosis not present

## 2015-02-10 DIAGNOSIS — H9190 Unspecified hearing loss, unspecified ear: Secondary | ICD-10-CM | POA: Diagnosis present

## 2015-02-10 DIAGNOSIS — Z95810 Presence of automatic (implantable) cardiac defibrillator: Secondary | ICD-10-CM | POA: Diagnosis not present

## 2015-02-10 DIAGNOSIS — Z880 Allergy status to penicillin: Secondary | ICD-10-CM | POA: Diagnosis not present

## 2015-02-10 DIAGNOSIS — I456 Pre-excitation syndrome: Secondary | ICD-10-CM | POA: Diagnosis present

## 2015-02-10 DIAGNOSIS — L97419 Non-pressure chronic ulcer of right heel and midfoot with unspecified severity: Secondary | ICD-10-CM | POA: Diagnosis present

## 2015-02-10 DIAGNOSIS — E43 Unspecified severe protein-calorie malnutrition: Secondary | ICD-10-CM | POA: Diagnosis present

## 2015-02-10 DIAGNOSIS — N179 Acute kidney failure, unspecified: Secondary | ICD-10-CM | POA: Diagnosis present

## 2015-02-10 DIAGNOSIS — G309 Alzheimer's disease, unspecified: Secondary | ICD-10-CM | POA: Diagnosis present

## 2015-02-10 DIAGNOSIS — I482 Chronic atrial fibrillation: Secondary | ICD-10-CM | POA: Diagnosis present

## 2015-02-10 DIAGNOSIS — I2510 Atherosclerotic heart disease of native coronary artery without angina pectoris: Secondary | ICD-10-CM | POA: Diagnosis present

## 2015-02-10 DIAGNOSIS — Z6828 Body mass index (BMI) 28.0-28.9, adult: Secondary | ICD-10-CM | POA: Diagnosis not present

## 2015-02-10 DIAGNOSIS — Z951 Presence of aortocoronary bypass graft: Secondary | ICD-10-CM | POA: Diagnosis not present

## 2015-02-10 DIAGNOSIS — E039 Hypothyroidism, unspecified: Secondary | ICD-10-CM | POA: Diagnosis present

## 2015-02-10 DIAGNOSIS — Z888 Allergy status to other drugs, medicaments and biological substances status: Secondary | ICD-10-CM | POA: Diagnosis not present

## 2015-02-10 DIAGNOSIS — Z7901 Long term (current) use of anticoagulants: Secondary | ICD-10-CM | POA: Diagnosis not present

## 2015-02-10 DIAGNOSIS — Z8249 Family history of ischemic heart disease and other diseases of the circulatory system: Secondary | ICD-10-CM | POA: Diagnosis not present

## 2015-02-10 DIAGNOSIS — I739 Peripheral vascular disease, unspecified: Secondary | ICD-10-CM | POA: Diagnosis present

## 2015-02-10 DIAGNOSIS — I130 Hypertensive heart and chronic kidney disease with heart failure and stage 1 through stage 4 chronic kidney disease, or unspecified chronic kidney disease: Secondary | ICD-10-CM | POA: Diagnosis present

## 2015-02-10 DIAGNOSIS — E119 Type 2 diabetes mellitus without complications: Secondary | ICD-10-CM | POA: Diagnosis present

## 2015-02-10 DIAGNOSIS — Z841 Family history of disorders of kidney and ureter: Secondary | ICD-10-CM | POA: Diagnosis not present

## 2015-02-10 LAB — CBC
HCT: 30 % — ABNORMAL LOW (ref 40.0–52.0)
HEMOGLOBIN: 9.4 g/dL — AB (ref 13.0–18.0)
MCH: 25.9 pg — AB (ref 26.0–34.0)
MCHC: 31.4 g/dL — AB (ref 32.0–36.0)
MCV: 82.5 fL (ref 80.0–100.0)
PLATELETS: 154 10*3/uL (ref 150–440)
RBC: 3.63 MIL/uL — AB (ref 4.40–5.90)
RDW: 17.9 % — ABNORMAL HIGH (ref 11.5–14.5)
WBC: 9.3 10*3/uL (ref 3.8–10.6)

## 2015-02-10 LAB — BASIC METABOLIC PANEL
ANION GAP: 13 (ref 5–15)
BUN: 89 mg/dL — ABNORMAL HIGH (ref 6–20)
CALCIUM: 8.1 mg/dL — AB (ref 8.9–10.3)
CO2: 22 mmol/L (ref 22–32)
CREATININE: 5.01 mg/dL — AB (ref 0.61–1.24)
Chloride: 102 mmol/L (ref 101–111)
GFR, EST AFRICAN AMERICAN: 11 mL/min — AB (ref >60)
GFR, EST NON AFRICAN AMERICAN: 9 mL/min — AB (ref >60)
GLUCOSE: 140 mg/dL — AB (ref 65–99)
Potassium: 4.8 mmol/L (ref 3.5–5.1)
Sodium: 137 mmol/L (ref 135–145)

## 2015-02-10 LAB — GLUCOSE, CAPILLARY
GLUCOSE-CAPILLARY: 254 mg/dL — AB (ref 65–99)
GLUCOSE-CAPILLARY: 139 mg/dL — AB (ref 65–99)
GLUCOSE-CAPILLARY: 158 mg/dL — AB (ref 65–99)
Glucose-Capillary: 136 mg/dL — ABNORMAL HIGH (ref 65–99)
Glucose-Capillary: 215 mg/dL — ABNORMAL HIGH (ref 65–99)

## 2015-02-10 LAB — HEPATIC FUNCTION PANEL
ALT: 18 U/L (ref 17–63)
AST: 36 U/L (ref 15–41)
Albumin: 3.2 g/dL — ABNORMAL LOW (ref 3.5–5.0)
Alkaline Phosphatase: 73 U/L (ref 38–126)
BILIRUBIN DIRECT: 0.1 mg/dL (ref 0.1–0.5)
BILIRUBIN TOTAL: 0.4 mg/dL (ref 0.3–1.2)
Indirect Bilirubin: 0.3 mg/dL (ref 0.3–0.9)
Total Protein: 6.7 g/dL (ref 6.5–8.1)

## 2015-02-10 LAB — TROPONIN I
TROPONIN I: 0.08 ng/mL — AB (ref <0.031)
TROPONIN I: 0.09 ng/mL — AB (ref <0.031)
Troponin I: 0.07 ng/mL — ABNORMAL HIGH (ref <0.031)

## 2015-02-10 LAB — BRAIN NATRIURETIC PEPTIDE: B Natriuretic Peptide: 225 pg/mL — ABNORMAL HIGH (ref 0.0–100.0)

## 2015-02-10 LAB — PROTIME-INR
INR: 4.21 — AB
PROTHROMBIN TIME: 40.5 s — AB (ref 11.4–15.0)

## 2015-02-10 LAB — MRSA PCR SCREENING: MRSA by PCR: NEGATIVE

## 2015-02-10 MED ORDER — TRAZODONE HCL 100 MG PO TABS
100.0000 mg | ORAL_TABLET | Freq: Every day | ORAL | Status: DC
Start: 2015-02-10 — End: 2015-02-13
  Administered 2015-02-10 – 2015-02-12 (×4): 100 mg via ORAL
  Filled 2015-02-10 (×4): qty 1

## 2015-02-10 MED ORDER — INSULIN ASPART 100 UNIT/ML ~~LOC~~ SOLN
0.0000 [IU] | Freq: Three times a day (TID) | SUBCUTANEOUS | Status: DC
  Administered 2015-02-10: 5 [IU] via SUBCUTANEOUS
  Administered 2015-02-10: 3 [IU] via SUBCUTANEOUS
  Administered 2015-02-10: 1 [IU] via SUBCUTANEOUS
  Administered 2015-02-11: 5 [IU] via SUBCUTANEOUS
  Administered 2015-02-11: 3 [IU] via SUBCUTANEOUS
  Administered 2015-02-11: 2 [IU] via SUBCUTANEOUS
  Administered 2015-02-12: 5 [IU] via SUBCUTANEOUS
  Administered 2015-02-12: 2 [IU] via SUBCUTANEOUS
  Administered 2015-02-12 – 2015-02-13 (×2): 3 [IU] via SUBCUTANEOUS
  Filled 2015-02-10: qty 3
  Filled 2015-02-10: qty 5
  Filled 2015-02-10: qty 2
  Filled 2015-02-10: qty 5
  Filled 2015-02-10 (×2): qty 3
  Filled 2015-02-10: qty 2
  Filled 2015-02-10: qty 3
  Filled 2015-02-10: qty 1
  Filled 2015-02-10: qty 5

## 2015-02-10 MED ORDER — FUROSEMIDE 10 MG/ML IJ SOLN
10.0000 mg/h | INTRAVENOUS | Status: AC
  Administered 2015-02-10: 10 mg/h via INTRAVENOUS
  Filled 2015-02-10: qty 25

## 2015-02-10 MED ORDER — INSULIN ASPART PROT & ASPART (70-30 MIX) 100 UNIT/ML ~~LOC~~ SUSP
10.0000 [IU] | Freq: Two times a day (BID) | SUBCUTANEOUS | Status: DC
  Administered 2015-02-10 (×2): 10 [IU] via SUBCUTANEOUS
  Filled 2015-02-10 (×2): qty 10

## 2015-02-10 MED ORDER — ACETAMINOPHEN 325 MG PO TABS: 650.0000 mg | ORAL_TABLET | Freq: Four times a day (QID) | ORAL | Status: DC | PRN

## 2015-02-10 MED ORDER — SODIUM CHLORIDE 0.9 % IJ SOLN
3.0000 mL | Freq: Two times a day (BID) | INTRAMUSCULAR | Status: DC
Start: 2015-02-10 — End: 2015-02-13
  Administered 2015-02-10 – 2015-02-13 (×6): 3 mL via INTRAVENOUS

## 2015-02-10 MED ORDER — OXYCODONE HCL 5 MG PO TABS: 5.0000 mg | ORAL_TABLET | ORAL | Status: DC | PRN

## 2015-02-10 MED ORDER — MEMANTINE HCL 10 MG PO TABS
10.0000 mg | ORAL_TABLET | Freq: Two times a day (BID) | ORAL | Status: DC
  Administered 2015-02-10 – 2015-02-13 (×8): 10 mg via ORAL
  Filled 2015-02-10 (×8): qty 1

## 2015-02-10 MED ORDER — ACETAMINOPHEN 650 MG RE SUPP: 650.0000 mg | Freq: Four times a day (QID) | RECTAL | Status: DC | PRN

## 2015-02-10 MED ORDER — INSULIN ASPART 100 UNIT/ML ~~LOC~~ SOLN
0.0000 [IU] | Freq: Every day | SUBCUTANEOUS | Status: DC
  Administered 2015-02-11: 2 [IU] via SUBCUTANEOUS
  Filled 2015-02-10: qty 2

## 2015-02-10 MED ORDER — TORSEMIDE 20 MG PO TABS
40.0000 mg | ORAL_TABLET | Freq: Every morning | ORAL | Status: DC
  Administered 2015-02-10 – 2015-02-13 (×4): 40 mg via ORAL
  Filled 2015-02-10 (×4): qty 2

## 2015-02-10 MED ORDER — SIMVASTATIN 20 MG PO TABS
20.0000 mg | ORAL_TABLET | Freq: Every day | ORAL | Status: DC
  Administered 2015-02-10 – 2015-02-12 (×4): 20 mg via ORAL
  Filled 2015-02-10 (×4): qty 1

## 2015-02-10 MED ORDER — TAMSULOSIN HCL 0.4 MG PO CAPS
0.4000 mg | ORAL_CAPSULE | Freq: Every day | ORAL | Status: DC
  Administered 2015-02-10 – 2015-02-13 (×4): 0.4 mg via ORAL
  Filled 2015-02-10 (×4): qty 1

## 2015-02-10 MED ORDER — METHYLPREDNISOLONE SODIUM SUCC 125 MG IJ SOLR
60.0000 mg | Freq: Four times a day (QID) | INTRAMUSCULAR | Status: DC
  Administered 2015-02-10: 60 mg via INTRAVENOUS
  Filled 2015-02-10: qty 2

## 2015-02-10 MED ORDER — ONDANSETRON HCL 4 MG/2ML IJ SOLN: 4.0000 mg | Freq: Four times a day (QID) | INTRAMUSCULAR | Status: DC | PRN

## 2015-02-10 MED ORDER — PANTOPRAZOLE SODIUM 40 MG PO TBEC
40.0000 mg | DELAYED_RELEASE_TABLET | Freq: Every day | ORAL | Status: DC
  Administered 2015-02-10 – 2015-02-13 (×4): 40 mg via ORAL
  Filled 2015-02-10 (×4): qty 1

## 2015-02-10 MED ORDER — ISOSORBIDE DINITRATE 10 MG PO TABS
10.0000 mg | ORAL_TABLET | Freq: Two times a day (BID) | ORAL | Status: DC
  Administered 2015-02-10 – 2015-02-12 (×7): 10 mg via ORAL
  Filled 2015-02-10 (×9): qty 1

## 2015-02-10 MED ORDER — IPRATROPIUM-ALBUTEROL 0.5-2.5 (3) MG/3ML IN SOLN
3.0000 mL | RESPIRATORY_TRACT | Status: DC | PRN
  Administered 2015-02-10: 3 mL via RESPIRATORY_TRACT
  Filled 2015-02-10: qty 3

## 2015-02-10 MED ORDER — DONEPEZIL HCL 5 MG PO TABS
10.0000 mg | ORAL_TABLET | Freq: Every morning | ORAL | Status: DC
  Administered 2015-02-10 – 2015-02-13 (×5): 10 mg via ORAL
  Filled 2015-02-10 (×4): qty 2

## 2015-02-10 MED ORDER — FUROSEMIDE 10 MG/ML IJ SOLN
20.0000 mg | Freq: Once | INTRAMUSCULAR | Status: AC
  Administered 2015-02-10: 20 mg via INTRAVENOUS
  Filled 2015-02-10: qty 4

## 2015-02-10 MED ORDER — MORPHINE SULFATE (PF) 2 MG/ML IV SOLN: 2.0000 mg | INTRAVENOUS | Status: DC | PRN

## 2015-02-10 MED ORDER — SENNOSIDES-DOCUSATE SODIUM 8.6-50 MG PO TABS
1.0000 | ORAL_TABLET | Freq: Every day | ORAL | Status: DC
  Administered 2015-02-10 – 2015-02-11 (×2): 1 via ORAL
  Filled 2015-02-10 (×2): qty 1

## 2015-02-10 MED ORDER — SEVELAMER CARBONATE 800 MG PO TABS
800.0000 mg | ORAL_TABLET | Freq: Three times a day (TID) | ORAL | Status: DC
  Administered 2015-02-10 – 2015-02-13 (×8): 800 mg via ORAL
  Filled 2015-02-10 (×8): qty 1

## 2015-02-10 MED ORDER — ONDANSETRON HCL 4 MG PO TABS: 4.0000 mg | ORAL_TABLET | Freq: Four times a day (QID) | ORAL | Status: DC | PRN

## 2015-02-10 MED ORDER — CHLORHEXIDINE GLUCONATE 0.12 % MT SOLN
15.0000 mL | Freq: Two times a day (BID) | OROMUCOSAL | Status: DC
  Administered 2015-02-10 – 2015-02-13 (×5): 15 mL via OROMUCOSAL
  Filled 2015-02-10 (×8): qty 15

## 2015-02-10 MED ORDER — SPIRONOLACTONE 25 MG PO TABS
12.5000 mg | ORAL_TABLET | Freq: Every morning | ORAL | Status: DC
  Administered 2015-02-10 – 2015-02-11 (×2): 12.5 mg via ORAL
  Filled 2015-02-10 (×2): qty 1

## 2015-02-10 MED ORDER — SERTRALINE HCL 50 MG PO TABS
25.0000 mg | ORAL_TABLET | Freq: Every morning | ORAL | Status: DC
  Administered 2015-02-10 – 2015-02-13 (×4): 25 mg via ORAL
  Filled 2015-02-10 (×4): qty 1

## 2015-02-10 MED ORDER — METHYLPREDNISOLONE SODIUM SUCC 125 MG IJ SOLR
60.0000 mg | Freq: Three times a day (TID) | INTRAMUSCULAR | Status: DC
  Administered 2015-02-10 – 2015-02-11 (×2): 60 mg via INTRAVENOUS
  Filled 2015-02-10 (×3): qty 2

## 2015-02-10 MED ORDER — CETYLPYRIDINIUM CHLORIDE 0.05 % MT LIQD
7.0000 mL | Freq: Two times a day (BID) | OROMUCOSAL | Status: DC
  Administered 2015-02-10 – 2015-02-13 (×6): 7 mL via OROMUCOSAL

## 2015-02-10 MED ORDER — WARFARIN SODIUM 5 MG PO TABS: 2.5000 mg | ORAL_TABLET | Freq: Every day | ORAL | Status: DC

## 2015-02-10 MED ORDER — LEVOTHYROXINE SODIUM 112 MCG PO TABS
112.0000 ug | ORAL_TABLET | Freq: Every morning | ORAL | Status: DC
  Administered 2015-02-10 – 2015-02-13 (×4): 112 ug via ORAL
  Filled 2015-02-10 (×4): qty 1

## 2015-02-10 NOTE — Progress Notes (Signed)
MD Sherryll Burger was notified that measurement for Life vest was 36. Not sure if it was an accurate reading due to pt mobility. No further orders.

## 2015-02-10 NOTE — ED Provider Notes (Signed)
[facility] Emergency Department Provider Note  ____________________________________________  Time seen: 11:15 PM  I have reviewed the triage vital signs and the nursing notes.   HISTORY  Chief Complaint Shortness of Breath    HPI MILBERN DOESCHER is a 81 y.o. male presents with progressive dyspnea times tonight. [facility] is currently at [facility]. Patient was noted to have exertional dyspnea this evening. Per EMS patient's O2 sat was 82 on the presentation with obvious increased work of breathing. Of note patient has a history of congestive heart failure Patient's family informed me that he received IV fluids at [facility] secondary to impairment in his kidney function.     Past Medical History  Diagnosis Date  . Diabetes mellitus without complication   . Hypertension   . Dementia   . A-fib   . Chronic systolic CHF (congestive heart failure)   . WPW (Wolff-Parkinson-White syndrome)   . PAD (peripheral artery disease)   . Hypothyroidism   . CAD (coronary artery disease)   . Alzheimer disease   . Depression   . Hyperlipidemia   . Pneumonia   . Wheezing   . Gait abnormality   . Weakness   . GERD (gastroesophageal reflux disease)   . Chronic kidney disease   . Enlarged prostate   . Iron deficiency anemia   . Vitamin D deficiency     Patient Active Problem List   Diagnosis Date Noted  . Urinary retention 02/04/2015  . Hip fracture requiring operative repair 11/25/2014    Past Surgical History  Procedure Laterality Date  . Coronary artery bypass graft    . Abdominal aortic aneurysm repair    . Pacemaker placement      defib/pacemaker  . Implantable cardioverter defibrillator implant    . Femur im nail Right 11/26/2014    Procedure: INTRAMEDULLARY (IM) NAIL FEMORAL;  Surgeon: Juanell Fairly, MD;  Location: [facility] ORS;  Service: Orthopedics;  Laterality: Right;  femur    Current Outpatient Rx  Name  Route  Sig  Dispense  Refill  .  acetaminophen (TYLENOL) 325 MG tablet   Oral   Take 650 mg by mouth every 4 (four) hours as needed.         Marland Kitchen alum & mag hydroxide-simeth (MAALOX/MYLANTA) 200-200-20 MG/5ML suspension   Oral   Take 30 mLs by mouth every 4 (four) hours as needed for indigestion. Patient not taking: Reported on 02/04/2015   355 mL   0   . amLODipine (NORVASC) 5 MG tablet   Oral   Take 1 tablet (5 mg total) by mouth daily. Patient not taking: Reported on 02/04/2015   30 tablet   0   . bisacodyl (DULCOLAX) 10 MG suppository   Rectal   Place 1 suppository (10 mg total) rectally daily as needed for moderate constipation. Patient not taking: Reported on 02/04/2015   12 suppository   0   . Cholecalciferol (VITAMIN D3) 2000 UNITS TABS   Oral   Take 2,000 Units by mouth.         . cyanocobalamin (,VITAMIN B-12,) 1000 MCG/ML injection   Intramuscular   Inject 1,000 mcg into the muscle every 30 (thirty) days.         Marland Kitchen docusate sodium (COLACE) 100 MG capsule   Oral   Take 300 mg by mouth at bedtime.         . donepezil (ARICEPT) 10 MG tablet   Oral   Take 10 mg by mouth every  morning.         Marland Kitchen EXPIRED: enoxaparin (LOVENOX) 30 MG/0.3ML injection   Subcutaneous   Inject 0.3 mLs (30 mg total) into the skin every 12 (twelve) hours.   3 mL   0   . ferrous fumarate-iron polysaccharide complex (TANDEM) 162-115.2 MG CAPS   Oral   Take 1 capsule by mouth See admin instructions. Pt takes on Monday, Wednesday, and Friday.         . insulin NPH-regular Human (HUMULIN 70/30) (70-30) 100 UNIT/ML injection   Subcutaneous   Inject 10-15 Units into the skin 2 (two) times daily with a meal. Pt uses 15 units with breakfast and 10 units with dinner.         . isosorbide dinitrate (ISORDIL) 10 MG tablet   Oral   Take 10 mg by mouth 2 (two) times daily.         Marland Kitchen levothyroxine (SYNTHROID, LEVOTHROID) 112 MCG tablet   Oral   Take 112 mcg by mouth every morning.         . memantine  (NAMENDA) 10 MG tablet   Oral   Take 10 mg by mouth 2 (two) times daily.          Marland Kitchen menthol-cetylpyridinium (CEPACOL) 3 MG lozenge   Oral   Take 1 lozenge (3 mg total) by mouth as needed for sore throat (sore throat). Patient not taking: Reported on 02/04/2015   100 tablet   12   . Multiple Vitamins-Minerals (PRESERVISION AREDS PO)   Oral   Take by mouth.         Marland Kitchen omeprazole (PRILOSEC) 20 MG capsule   Oral   Take 20 mg by mouth 2 (two) times daily.          Marland Kitchen oxyCODONE (OXY IR/ROXICODONE) 5 MG immediate release tablet   Oral   Take 1-2 tablets (5-10 mg total) by mouth every 4 (four) hours as needed for breakthrough pain ((for MODERATE breakthrough pain)).   20 tablet   0   . senna-docusate (SENNA PLUS) 8.6-50 MG per tablet   Oral   Take 1 tablet by mouth daily.         . sertraline (ZOLOFT) 25 MG tablet   Oral   Take 25 mg by mouth every morning.         . sevelamer (RENAGEL) 800 MG tablet   Oral   Take by mouth.         . sevelamer carbonate (RENVELA) 800 MG tablet   Oral   Take 800 mg by mouth 2 (two) times daily.         . simvastatin (ZOCOR) 40 MG tablet   Oral   Take 20 mg by mouth at bedtime.         Marland Kitchen spironolactone (ALDACTONE) 25 MG tablet   Oral   Take 12.5 mg by mouth every morning.         . sulfamethoxazole-trimethoprim (BACTRIM DS,SEPTRA DS) 800-160 MG per tablet   Oral   Take 1 tablet by mouth 2 (two) times daily.         . tamsulosin (FLOMAX) 0.4 MG CAPS capsule   Oral   Take 1 capsule (0.4 mg total) by mouth daily.   20 capsule   0   . torsemide (DEMADEX) 20 MG tablet   Oral   Take 40 mg by mouth every morning.         . traMADol (ULTRAM) 50 MG tablet  Oral   Take 1 tablet (50 mg total) by mouth every 6 (six) hours as needed for moderate pain.   20 tablet   0   . traZODone (DESYREL) 100 MG tablet   Oral   Take by mouth.         . warfarin (COUMADIN) 1 MG tablet   Oral   Take 1 mg by mouth daily.          Marland Kitchen warfarin (COUMADIN) 2.5 MG tablet   Oral   Take 2.5 mg by mouth daily.         Marland Kitchen warfarin (COUMADIN) 6 MG tablet   Oral   Take 6 mg by mouth at bedtime.           Allergies Ambien; Erythromycin; and Penicillins  Family History  Problem Relation Age of Onset  . Kidney disease Neg Hx   . Prostate cancer Neg Hx   . Bladder Cancer Neg Hx     Social History Social History  Substance Use Topics  . Smoking status: Former Games developer  . Smokeless tobacco: None     Comment: quit 1990  . Alcohol Use: No    Review of Systems  Constitutional: Negative for fever. Eyes: Negative for visual changes. ENT: Negative for sore throat. Cardiovascular: Negative for chest pain. Respiratory: Positive for shortness of breath. Gastrointestinal: Negative for abdominal pain, vomiting and diarrhea. Genitourinary: Negative for dysuria. Musculoskeletal: Negative for back pain. Skin: Negative for rash. Neurological: Negative for headaches, focal weakness or numbness.   10-point ROS otherwise negative.  ____________________________________________   PHYSICAL EXAM:  VITAL SIGNS: ED Triage Vitals  Enc Vitals Group     BP 02/09/15 2315 115/71 mmHg     Pulse Rate 02/09/15 2315 75     Resp 02/09/15 2315 16     Temp 02/09/15 2315 97.6 F (36.4 C)     Temp Source 02/09/15 2315 Oral     SpO2 02/09/15 2315 90 %     Weight 02/09/15 2315 190 lb (86.183 kg)     Height 02/09/15 2315 5\' 9"  (1.753 m)     Head Cir --      [facility] --      Pain Score 02/09/15 2316 0     Pain Loc --      Pain Edu? --      Excl. in GC? --     Constitutional: Alert and oriented. Well appearing and in no distress. Eyes: Conjunctivae are normal. PERRL. Normal extraocular movements. ENT   Head: Normocephalic and atraumatic.   Nose: No congestion/rhinnorhea.   Mouth/Throat: Mucous membranes are moist.   Neck: No stridor. Hematological/Lymphatic/Immunilogical: No cervical  lymphadenopathy. Cardiovascular: Normal rate, regular rhythm. Normal and symmetric distal pulses are present in all extremities. No murmurs, rubs, or gallops. Respiratory: Normal respiratory effort without tachypnea nor retractions. Breath sounds are clear and equal bilaterally. Bibasilar rales noted Gastrointestinal: Soft and nontender. No distention. There is no CVA tenderness. Genitourinary: deferred Musculoskeletal: Nontender with normal range of motion in all extremities. No joint effusions.  No lower extremity tenderness nor edema. Neurologic:  Normal speech and language. No gross focal neurologic deficits are appreciated. Speech is normal.  Skin:  Skin is warm, dry and intact. No rash noted. Psychiatric: Mood and affect are normal. Speech and behavior are normal. Patient exhibits appropriate insight and judgment.  ____________________________________________    LABS (pertinent positives/negatives)  Labs Reviewed  BASIC METABOLIC PANEL - Abnormal; Notable for the following:    Potassium  5.2 (*)    CO2 21 (*)    Glucose, Bld 130 (*)    BUN 84 (*)    Creatinine, Ser 4.79 (*)    Calcium 7.6 (*)    GFR calc non Af Amer 10 (*)    GFR calc Af Amer 11 (*)    All other components within normal limits  CBC - Abnormal; Notable for the following:    RBC 3.62 (*)    Hemoglobin 9.2 (*)    HCT 29.8 (*)    MCH 25.5 (*)    MCHC 31.0 (*)    RDW 18.1 (*)    Platelets 147 (*)    All other components within normal limits  BRAIN NATRIURETIC PEPTIDE - Abnormal; Notable for the following:    B Natriuretic Peptide 225.0 (*)    All other components within normal limits  TROPONIN I     ____________________________________________   EKG  ED ECG REPORT I, Demitrus Francisco, Russell N, the attending physician, personally viewed and interpreted this ECG.   Date: 02/10/2015  EKG Time: 11:19 PM  Rate: 76  Rhythm: Ventricular paced rhythm  Axis: None  Intervals normal  ST&T Change:  None   ____________________________________________    RADIOLOGY  Chest x-ray revealed  DG Chest Portable 1 View (Final result) Result time: 02/10/15 00:10:15   Final result by Rad Results In Interface (02/10/15 00:10:15)   Narrative:   CLINICAL DATA: Shortness of breath. Cough.  EXAM: PORTABLE CHEST - 1 VIEW  COMPARISON: 11/25/2014  FINDINGS: Postoperative changes in the mediastinum. Cardiac pacemaker. Cardiac enlargement with mild pulmonary vascular congestion. Blunting of the costophrenic angles suggesting bilateral effusions. Hazy perihilar opacities may indicate edema. No pneumothorax. Mediastinal contours appear intact. Linear shadow along the right chest wall probably represents skin fold.  IMPRESSION: Cardiac enlargement with pulmonary vascular congestion, probable perihilar edema, and small bilateral pleural effusions.   Electronically Signed By: Burman Nieves M.D. On: 02/10/2015 00:10     Critical care:  INITIAL IMPRESSION / ASSESSMENT AND PLAN / ED COURSE  Pertinent labs & imaging results that were available during my care of the patient were reviewed by me and considered in my medical decision making (see chart for details).  DuoNeb administered Lasix 20 mg given. Patient history of physical exam as well as lab data consistent with pulmonary edema with respiratory failure. Patient discussed with Dr. Clint Guy for hospital admission   ____________________________________________   FINAL CLINICAL IMPRESSION(S) / ED DIAGNOSES  Final diagnoses:  Acute respiratory failure with hypoxia  Acute on chronic renal insufficiency  Acute pulmonary edema      Darci Current, MD 02/10/15 [phone]

## 2015-02-10 NOTE — H&P (Signed)
[facility] - [place] at [facility]   PATIENT NAME: Tim Myers    MR#:  [id number]  DATE OF BIRTH:  27-Oct-1923   DATE OF ADMISSION:  02/09/2015  PRIMARY CARE PHYSICIAN: Lauro Regulus., MD   REQUESTING/REFERRING PHYSICIAN: Manson Passey  CHIEF COMPLAINT:   Chief Complaint  Patient presents with  . Shortness of Breath    HISTORY OF PRESENT ILLNESS:  Tim Myers  is a 81 y.o. male with a known history of systolic congestive heart failure status post AICD placement, Wolff-Parkinson-White status post ablation, chronic kidney disease baseline creatinine around 2.5 presenting with shortness of breath from his nursing facility. He was noted to have worsening kidney function so for the last 2 days he has received IV fluids. He is now presenting with shortness of breath, wheezing for 1 day duration. Denies any chest pain however I question the reliability of his statements.  PAST MEDICAL HISTORY:   Past Medical History  Diagnosis Date  . Diabetes mellitus without complication   . Hypertension   . Dementia   . A-fib   . Chronic systolic CHF (congestive heart failure)   . WPW (Wolff-Parkinson-White syndrome)   . PAD (peripheral artery disease)   . Hypothyroidism   . CAD (coronary artery disease)   . Alzheimer disease   . Depression   . Hyperlipidemia   . Pneumonia   . Wheezing   . Gait abnormality   . Weakness   . GERD (gastroesophageal reflux disease)   . Chronic kidney disease   . Enlarged prostate   . Iron deficiency anemia   . Vitamin D deficiency     PAST SURGICAL HISTORY:   Past Surgical History  Procedure Laterality Date  . Coronary artery bypass graft    . Abdominal aortic aneurysm repair    . Pacemaker placement      defib/pacemaker  . Implantable cardioverter defibrillator implant    . Femur im nail Right 11/26/2014    Procedure: INTRAMEDULLARY (IM) NAIL FEMORAL;  Surgeon: Juanell Fairly, MD;  Location: [facility] ORS;  Service: Orthopedics;   Laterality: Right;  femur    SOCIAL HISTORY:   Social History  Substance Use Topics  . Smoking status: Former Games developer  . Smokeless tobacco: Not on file     Comment: quit 1990  . Alcohol Use: No    FAMILY HISTORY:   Family History  Problem Relation Age of Onset  . Kidney disease Neg Hx   . Prostate cancer Neg Hx   . Bladder Cancer Neg Hx   . Hypertension Other     DRUG ALLERGIES:   Allergies  Allergen Reactions  . Ambien [Zolpidem Tartrate]   . Erythromycin Other (See Comments)    Reaction:  Unknown   . Penicillins Swelling    REVIEW OF SYSTEMS:  Unobtainable given patient's mental status/medical condition  MEDICATIONS AT HOME:   Prior to Admission medications   Medication Sig Start Date End Date Taking? Authorizing Provider  acetaminophen (TYLENOL) 325 MG tablet Take 650 mg by mouth every 4 (four) hours as needed.    Historical Provider, MD  alum & mag hydroxide-simeth (MAALOX/MYLANTA) 200-200-20 MG/5ML suspension Take 30 mLs by mouth every 4 (four) hours as needed for indigestion. Patient not taking: Reported on 02/04/2015 11/29/14   Altamese Dilling, MD  amLODipine (NORVASC) 5 MG tablet Take 1 tablet (5 mg total) by mouth daily. Patient not taking: Reported on 02/04/2015 11/29/14   Altamese Dilling, MD  bisacodyl (DULCOLAX) 10 MG suppository Place  1 suppository (10 mg total) rectally daily as needed for moderate constipation. Patient not taking: Reported on 02/04/2015 11/29/14   Altamese Dilling, MD  Cholecalciferol (VITAMIN D3) 2000 UNITS TABS Take 2,000 Units by mouth.    Historical Provider, MD  cyanocobalamin (,VITAMIN B-12,) 1000 MCG/ML injection Inject 1,000 mcg into the muscle every 30 (thirty) days.    Historical Provider, MD  docusate sodium (COLACE) 100 MG capsule Take 300 mg by mouth at bedtime.    Historical Provider, MD  donepezil (ARICEPT) 10 MG tablet Take 10 mg by mouth every morning.    Historical Provider, MD  enoxaparin (LOVENOX) 30  MG/0.3ML injection Inject 0.3 mLs (30 mg total) into the skin every 12 (twelve) hours. 11/29/14 12/02/14  Altamese Dilling, MD  ferrous fumarate-iron polysaccharide complex (TANDEM) 162-115.2 MG CAPS Take 1 capsule by mouth See admin instructions. Pt takes on Monday, Wednesday, and Friday.    Historical Provider, MD  insulin NPH-regular Human (HUMULIN 70/30) (70-30) 100 UNIT/ML injection Inject 10-15 Units into the skin 2 (two) times daily with a meal. Pt uses 15 units with breakfast and 10 units with dinner.    Historical Provider, MD  isosorbide dinitrate (ISORDIL) 10 MG tablet Take 10 mg by mouth 2 (two) times daily.    Historical Provider, MD  levothyroxine (SYNTHROID, LEVOTHROID) 112 MCG tablet Take 112 mcg by mouth every morning.    Historical Provider, MD  memantine (NAMENDA) 10 MG tablet Take 10 mg by mouth 2 (two) times daily.     Historical Provider, MD  menthol-cetylpyridinium (CEPACOL) 3 MG lozenge Take 1 lozenge (3 mg total) by mouth as needed for sore throat (sore throat). Patient not taking: Reported on 02/04/2015 11/29/14   Altamese Dilling, MD  Multiple Vitamins-Minerals (PRESERVISION AREDS PO) Take by mouth.    Historical Provider, MD  omeprazole (PRILOSEC) 20 MG capsule Take 20 mg by mouth 2 (two) times daily.     Historical Provider, MD  oxyCODONE (OXY IR/ROXICODONE) 5 MG immediate release tablet Take 1-2 tablets (5-10 mg total) by mouth every 4 (four) hours as needed for breakthrough pain ((for MODERATE breakthrough pain)). 11/29/14   Altamese Dilling, MD  senna-docusate (SENNA PLUS) 8.6-50 MG per tablet Take 1 tablet by mouth daily.    Historical Provider, MD  sertraline (ZOLOFT) 25 MG tablet Take 25 mg by mouth every morning.    Historical Provider, MD  sevelamer (RENAGEL) 800 MG tablet Take by mouth. 01/02/08   Historical Provider, MD  sevelamer carbonate (RENVELA) 800 MG tablet Take 800 mg by mouth 2 (two) times daily.    Historical Provider, MD  simvastatin (ZOCOR)  40 MG tablet Take 20 mg by mouth at bedtime.    Historical Provider, MD  spironolactone (ALDACTONE) 25 MG tablet Take 12.5 mg by mouth every morning.    Historical Provider, MD  sulfamethoxazole-trimethoprim (BACTRIM DS,SEPTRA DS) 800-160 MG per tablet Take 1 tablet by mouth 2 (two) times daily.    Historical Provider, MD  tamsulosin (FLOMAX) 0.4 MG CAPS capsule Take 1 capsule (0.4 mg total) by mouth daily. 11/29/14   Altamese Dilling, MD  torsemide (DEMADEX) 20 MG tablet Take 40 mg by mouth every morning.    Historical Provider, MD  traMADol (ULTRAM) 50 MG tablet Take 1 tablet (50 mg total) by mouth every 6 (six) hours as needed for moderate pain. 11/29/14   Altamese Dilling, MD  traZODone (DESYREL) 100 MG tablet Take by mouth. 04/09/14   Historical Provider, MD  warfarin (COUMADIN) 1 MG tablet  Take 1 mg by mouth daily.    Historical Provider, MD  warfarin (COUMADIN) 2.5 MG tablet Take 2.5 mg by mouth daily.    Historical Provider, MD  warfarin (COUMADIN) 6 MG tablet Take 6 mg by mouth at bedtime.    Historical Provider, MD      VITAL SIGNS:  Blood pressure 110/76, pulse 74, temperature 97.6 F (36.4 C), temperature source Oral, resp. rate 20, height 5\' 9"  (1.753 m), weight 190 lb (86.183 kg), SpO2 87 %.  PHYSICAL EXAMINATION:  VITAL SIGNS: Filed Vitals:   02/10/15 0130  BP: 110/76  Pulse: 74  Temp:   Resp: 20   GENERAL:81 y.o.male currently in no acute distress. Chronically ill appearing  HEAD: Normocephalic, atraumatic.  EYES: Pupils equal, round, reactive to light. Extraocular muscles intact. No scleral icterus.  MOUTH: Moist mucosal membrane. Dentition poor. No abscess noted.  EAR, NOSE, THROAT: Clear without exudates. No external lesions.  NECK: Supple. No thyromegaly. No nodules. No JVD.  PULMONARY: Diffuse coarse breath sounds with bibasilar rhonchi and no wheezing. No use of accessory muscles, Good respiratory effort. good air entry bilaterally CHEST: Nontender to  palpation.  CARDIOVASCULAR: S1 and S2. Regular rate and rhythm. No murmurs, rubs, or gallops. 4+ edema bilateral lower extremity edema up to his mid back. Pedal pulses 2+ bilaterally.  GASTROINTESTINAL: Soft, nontender, nondistended. No masses. Positive bowel sounds. No hepatosplenomegaly.  MUSCULOSKELETAL: No swelling, clubbing, or edema. Range of motion full in all extremities.  NEUROLOGIC: Cranial nerves II through XII are intact. No gross focal neurological deficits. Sensation intact. Reflexes intact.  SKIN: Bilateral pressure ulcers on the heels, No further ulceration, lesions, rashes, or cyanosis. Skin warm and dry. Turgor intact.  PSYCHIATRIC: Mood, affect within normal limits. The patient is awake, alert and oriented to person. Insight, judgment poor.    LABORATORY PANEL:   CBC  Recent Labs Lab 02/09/15 2323  WBC 9.2  HGB 9.2*  HCT 29.8*  PLT 147*   ------------------------------------------------------------------------------------------------------------------  Chemistries   Recent Labs Lab 02/09/15 2323  NA 137  K 5.2*  CL 103  CO2 21*  GLUCOSE 130*  BUN 84*  CREATININE 4.79*  CALCIUM 7.6*   ------------------------------------------------------------------------------------------------------------------  Cardiac Enzymes  Recent Labs Lab 02/09/15 2331  TROPONINI 0.07*   ------------------------------------------------------------------------------------------------------------------  RADIOLOGY:  Dg Chest Portable 1 View  02/10/2015   CLINICAL DATA:  Shortness of breath.  Cough.  EXAM: PORTABLE CHEST - 1 VIEW  COMPARISON:  11/25/2014  FINDINGS: Postoperative changes in the mediastinum. Cardiac pacemaker. Cardiac enlargement with mild pulmonary vascular congestion. Blunting of the costophrenic angles suggesting bilateral effusions. Hazy perihilar opacities may indicate edema. No pneumothorax. Mediastinal contours appear intact. Linear shadow along the right  chest wall probably represents skin fold.  IMPRESSION: Cardiac enlargement with pulmonary vascular congestion, probable perihilar edema, and small bilateral pleural effusions.   Electronically Signed   By: Burman Nieves M.D.   On: 02/10/2015 00:10    EKG:   Orders placed or performed during the hospital encounter of 02/09/15  . ED EKG  . ED EKG    IMPRESSION AND PLAN:   81 year old Caucasian gentleman history of coronary artery disease status post bypass, AICD placement, systolic congestive heart failure presenting with shortness of breath.  1. Acute on chronic systolic congestive heart failure: Admit to telemetry, follow ins and outs, daily weights, DuoNeb treatment, diuresis, consult cardiology, edema and is worsened by protein malnutrition 2. Acute kidney injury: Continue diuresis, follow urine output/renal function. Hopefully with diuresis And  improve cardiac output and can hopefully improve renal function. Hold nephrotoxic agents 3. Atrial fibrillation chronic: Currently V-paced, check INR, continue warfarin 4. Essential hypertension: Norvasc 5. Hypothyroidism unspecified: Synthroid 6. Venous thromboembolism prophylactic: Therapeutic warfarin   All the records are reviewed and case discussed with ED provider. Management plans discussed with the patient, family and they are in agreement.  CODE STATUS: Full  TOTAL TIME TAKING CARE OF THIS PATIENT: 45 minutes.    Yecheskel Kurek,  Mardi Mainland.D on 02/10/2015 at 1:31 AM  Between 7am to 6pm - Pager - [phone]  After 6pm: House Pager: - [phone]  [facility]  Office  [phone]  CC: Primary care physician; Lauro Regulus., MD

## 2015-02-10 NOTE — Clinical Social Work Note (Signed)
Clinical Social Work Assessment  Patient Details  Name: Tim Myers MRN: [id number] Date of Birth: [birth year]  Date of referral:  02/10/15               Reason for consult:  Facility Placement (pt is from [facility] STR possibly moving into LTC)                Permission sought to share information with:  Facility Medical sales representative, Family Supports Permission granted to share information::  Yes, Verbal Permission Granted  Name::     Wife Tim Myers [phone]  Agency::  [facility]  Relationship::     Contact Information:     Housing/Transportation Living arrangements for the past 2 months:  Skilled Holiday representative, Single Family Home Source of Information:  Patient, Medical Team, Facility Patient Interpreter Needed:  None Criminal Activity/Legal Involvement Pertinent to Current Situation/Hospitalization:  No - Comment as needed Significant Relationships:  Spouse Lives with:  Spouse Do you feel safe going back to the place where you live?   (pt not oriented enough to participate fully in assessment.  ) Need for family participation in patient care:  Yes (Comment)  Care giving concerns:  Pt's wife was concerned with how much insurance would contiune to cover for pt's STR stay and hospital stay.  CSW is currently working with previous facility to determine how many SNF days were used.     Social Worker assessment / plan:  CSW attempted to speak to pt however pt was not oriented enough to participate in assessment.  CSW spoke to pt's wife and verified that pt was currently at [facility] for STR.  He had come to that facility from [facility].  CSW is currently contacting both facilities to do determine how many SNF days have been used.  CSW will f/u with pt's wife once this information has been obtained.  CSW spoke to current facility and pt's wife about possible LTC.  Pt's wife realizes that pt may need to become a resident at [facility].  CSW confirmed that medicaid had  beed started at [facility] for pt's LTC conversion.  CSW will continue to follow.   Employment status:  Retired, Disabled (Comment on whether or not currently receiving Disability) Insurance information:    PT Recommendations:    Information / Referral to community resources:     Patient/Family's Response to care:  Pt's wife was in agreement with DC back to [facility]   Patient/Family's Understanding of and Emotional Response to Diagnosis, Current Treatment, and Prognosis:  Pt's wife was in agreement with DC back to [facility].  Understanding was verbalized by pt's wife  Emotional Assessment Appearance:  Appears stated age Attitude/Demeanor/Rapport:   (pleasent) Affect (typically observed):   (pt was SOB when he did attempt to speak to pt.) Orientation:  Oriented to Self, Oriented to Place, Fluctuating Orientation (Suspected and/or reported Sundowners) Alcohol / Substance use:  Never Used Psych involvement (Current and /or in the community):  No (Comment)  Discharge Needs  Concerns to be addressed:  Care Coordination Readmission within the last 30 days:  No Current discharge risk:  Physical Impairment Barriers to Discharge:  Continued Medical Work up   [employer], LCSW 02/10/2015, 5:25 PM

## 2015-02-10 NOTE — Progress Notes (Signed)
[facility] - [place] at [facility]   PATIENT NAME: Tim Myers    MR#:  [id number]  DATE OF BIRTH:  [birth year]  SUBJECTIVE:  CHIEF COMPLAINT:   Chief Complaint  Patient presents with  . Shortness of Breath  Seems very short of breath, has generalized anasarca.  Son at bedside. REVIEW OF SYSTEMS:  Review of Systems  Constitutional: Negative for fever, weight loss, malaise/fatigue and diaphoresis.  HENT: Negative for ear discharge, ear pain, hearing loss, nosebleeds, sore throat and tinnitus.   Eyes: Negative for blurred vision and pain.  Respiratory: Positive for shortness of breath. Negative for cough, hemoptysis and wheezing.   Cardiovascular: Negative for chest pain, palpitations, orthopnea and leg swelling.  Gastrointestinal: Negative for heartburn, nausea, vomiting, abdominal pain, diarrhea, constipation and blood in stool.  Genitourinary: Negative for dysuria, urgency and frequency.  Musculoskeletal: Negative for myalgias and back pain.  Skin: Negative for itching and rash.  Neurological: Negative for dizziness, tingling, tremors, focal weakness, seizures, weakness and headaches.  Psychiatric/Behavioral: Negative for depression. The patient is not nervous/anxious.    DRUG ALLERGIES:   Allergies  Allergen Reactions  . Ambien [Zolpidem Tartrate]   . Erythromycin Other (See Comments)    Reaction:  Unknown   . Penicillins Swelling   VITALS:  Blood pressure 125/59, pulse 72, temperature 98 F (36.7 C), temperature source Oral, resp. rate 16, height  (1.753 m), weight 86.41 kg (190 lb 8 oz), SpO2 95 %. PHYSICAL EXAMINATION:  Physical Exam  Constitutional: He is oriented to person, place, and time and well-developed, well-nourished, and in no distress.  HENT:  Head: Normocephalic and atraumatic.  Eyes: Conjunctivae and EOM are normal. Pupils are equal, round, and reactive to light.  Neck: Normal range of motion. Neck supple. No tracheal  deviation present. No thyromegaly present.  Cardiovascular: Normal rate, regular rhythm and normal heart sounds.   Pulmonary/Chest: Effort normal and breath sounds normal. No respiratory distress. He has no wheezes. He exhibits no tenderness.  Abdominal: Soft. Bowel sounds are normal. He exhibits no distension. There is no tenderness.  Musculoskeletal: Normal range of motion. He exhibits edema.  Neurological: He is alert and oriented to person, place, and time. No cranial nerve deficit.  Skin: Skin is warm and dry. No rash noted.  Bilateral pressure ulcers on heels  Psychiatric: Mood and affect normal.   LABORATORY PANEL:   CBC  Recent Labs Lab 02/10/15 0215  WBC 9.3  HGB 9.4*  HCT 30.0*  PLT 154   ------------------------------------------------------------------------------------------------------------------ Chemistries   Recent Labs Lab 02/10/15 0215  NA 137  K 4.8  CL 102  CO2 22  GLUCOSE 140*  BUN 89*  CREATININE 5.01*  CALCIUM 8.1*  AST 36  ALT 18  ALKPHOS 73  BILITOT 0.4   RADIOLOGY:  Dg Chest Portable 1 View  02/10/2015   CLINICAL DATA:  Shortness of breath.  Cough.  EXAM: PORTABLE CHEST - 1 VIEW  COMPARISON:  11/25/2014  FINDINGS: Postoperative changes in the mediastinum. Cardiac pacemaker. Cardiac enlargement with mild pulmonary vascular congestion. Blunting of the costophrenic angles suggesting bilateral effusions. Hazy perihilar opacities may indicate edema. No pneumothorax. Mediastinal contours appear intact. Linear shadow along the right chest wall probably represents skin fold.  IMPRESSION: Cardiac enlargement with pulmonary vascular congestion, probable perihilar edema, and small bilateral pleural effusions.   Electronically Signed   By: Burman Nieves M.D.   On: 02/10/2015 00:10   ASSESSMENT AND PLAN:  81 year old Caucasian gentleman history of  coronary artery disease status post bypass, AICD placement, systolic congestive heart failure presenting  with shortness of breath.  1. Acute on chronic systolic congestive heart failure: continue telemetry, follow ins and outs, daily weights, DuoNeb treatment, diuresis, pending consult to cardiology, has generalised anasarca. Will start lasix drip.  2. Acute kidney injury: Continue diuresis, follow urine output/renal function. Hopefully with diuresis And improve cardiac output and can hopefully improve renal function. Hold nephrotoxic agents. Nephro c/s  3. Atrial fibrillation chronic: Currently V-paced, check INR, continue warfarin bur will have discussion with family before D/C to see if he is even a good candidate in long run.  4. Generalized anasarca: likely multifactorial, with severe protein calorie malnutrition and cardiorenal syndrome, was trying Lasix drip low-dose, await cardiology and nephrology input  5. Hypothyroidism unspecified: continue Synthroid  6. Venous thromboembolism prophylactic: Therapeutic warfarin   All the records are reviewed and case discussed with Care Management/Social Worker. Management plans discussed with the patient, family and they are in agreement.  CODE STATUS: DNR  TOTAL TIME (Critical Care)TAKING CARE OF THIS PATIENT: 79 minutes.   More than 50% of the time was spent in counseling/coordination of care: YES  POSSIBLE D/C IN 1-2 DAYS, DEPENDING ON CLINICAL CONDITION.  Had a long discussion with family, they are in agreement with aggressive diuresis at this point to see if she has any improvement, if not, they are also open to consider hospice.  We will wait another 24-48 hours before looking into hospice   [facility], Camia Dipinto M.D on 02/10/2015 at 79:30 PM  Between 7am to 6pm - Pager - [phone]  After 6pm go to [email or web address] - password [email or web address] [facility]  [facility]  Office  [phone]  CC:  Primary care physician; Lauro Regulus., MD

## 2015-02-10 NOTE — Consult Note (Signed)
[facility] Consultation Note  Patient ID: Tim [facility], MRN: [id number], DOB/AGE: [birth year] 81 y.o. Admit date: 02/09/2015   Date of Consult: 02/10/2015 Primary Physician: Lauro Regulus., MD Primary Cardiologist: None  Chief Complaint:  Chief Complaint  Patient presents with  . Shortness of Breath   Reason for Consult: acute on chronic systolic dysfunction congestive heart failure  HPI: 81 y.o. male with known previous LV systolic dysfunction with acute on chronic systolic dysfunction congestive heart failure with progressive shortness of breath edema and weakness. The patient has had known chronic atrial fibrillation nonvalvular in nature for which the patient has been on appropriate medication management. This includes a defibrillator pacemaker for which the patient has had for quite some time. There is apparently no evidence of defibrillations or other need for change in ICD treatment. The patient additionally has had diabetes hypertension and hyperlipidemia. Hyperlipidemia has been stable on appropriate medication management including simvastatin 20 mg. The patient additionally has had isosorbide for further risk reduction of anginal symptoms. He does have chronic kidney disease stage III for which appears to be exacerbated at this time and likely contributing to his significant acute on chronic systolic dysfunction heart failure. Intravenous Lasix has helped somewhat to improve this status. Currently the patient has had since of significant myocardial infarction  Past Medical History  Diagnosis Date  . Diabetes mellitus without complication   . Hypertension   . Dementia   . A-fib   . Chronic systolic CHF (congestive heart failure)   . WPW (Wolff-Parkinson-White syndrome)   . PAD (peripheral artery disease)   . Hypothyroidism   . CAD (coronary artery disease)   . Alzheimer disease   . Depression   . Hyperlipidemia   . Pneumonia   . Wheezing   . Gait  abnormality   . Weakness   . GERD (gastroesophageal reflux disease)   . Chronic kidney disease   . Enlarged prostate   . Iron deficiency anemia   . Vitamin D deficiency       Surgical History:  Past Surgical History  Procedure Laterality Date  . Coronary artery bypass graft    . Abdominal aortic aneurysm repair    . Pacemaker placement      defib/pacemaker  . Implantable cardioverter defibrillator implant    . Femur im nail Right 11/26/2014    Procedure: INTRAMEDULLARY (IM) NAIL FEMORAL;  Surgeon: Juanell Fairly, MD;  Location: [facility] ORS;  Service: Orthopedics;  Laterality: Right;  femur     Home Meds: Prior to Admission medications   Medication Sig Start Date End Date Taking? Authorizing Provider  acetaminophen (TYLENOL) 325 MG tablet Take 650 mg by mouth every 4 (four) hours as needed.   Yes Historical Provider, MD  Cholecalciferol (VITAMIN D3) 2000 UNITS TABS Take 2,000 Units by mouth.   Yes Historical Provider, MD  cyanocobalamin (,VITAMIN B-12,) 1000 MCG/ML injection Inject 1,000 mcg into the muscle every 30 (thirty) days.   Yes Historical Provider, MD  docusate sodium (COLACE) 100 MG capsule Take 300 mg by mouth at bedtime.   Yes Historical Provider, MD  donepezil (ARICEPT) 10 MG tablet Take 10 mg by mouth every morning.   Yes Historical Provider, MD  ferrous fumarate-iron polysaccharide complex (TANDEM) 162-115.2 MG CAPS Take 1 capsule by mouth See admin instructions. Pt takes on Monday, Wednesday, and Friday.   Yes Historical Provider, MD  insulin NPH-regular Human (HUMULIN 70/30) (70-30) 100 UNIT/ML injection Inject 10-15 Units into the skin 2 (two) times daily  with a meal. Pt uses 15 units with breakfast and 10 units with dinner.   Yes Historical Provider, MD  isosorbide dinitrate (ISORDIL) 10 MG tablet Take 10 mg by mouth 2 (two) times daily.   Yes Historical Provider, MD  levothyroxine (SYNTHROID, LEVOTHROID) 112 MCG tablet Take 112 mcg by mouth every morning.   Yes  Historical Provider, MD  memantine (NAMENDA) 10 MG tablet Take 10 mg by mouth 2 (two) times daily.    Yes Historical Provider, MD  Multiple Vitamins-Minerals (PRESERVISION AREDS PO) Take by mouth.   Yes Historical Provider, MD  omeprazole (PRILOSEC) 20 MG capsule Take 20 mg by mouth 2 (two) times daily.    Yes Historical Provider, MD  oxyCODONE (OXY IR/ROXICODONE) 5 MG immediate release tablet Take 1-2 tablets (5-10 mg total) by mouth every 4 (four) hours as needed for breakthrough pain ((for MODERATE breakthrough pain)). 11/29/14  Yes Altamese Dilling, MD  senna-docusate (SENNA PLUS) 8.6-50 MG per tablet Take 1 tablet by mouth daily.   Yes Historical Provider, MD  sertraline (ZOLOFT) 25 MG tablet Take 25 mg by mouth every morning.   Yes Historical Provider, MD  sevelamer (RENAGEL) 800 MG tablet Take by mouth. 01/02/08  Yes Historical Provider, MD  simvastatin (ZOCOR) 40 MG tablet Take 20 mg by mouth at bedtime.   Yes Historical Provider, MD  spironolactone (ALDACTONE) 25 MG tablet Take 12.5 mg by mouth every morning.   Yes Historical Provider, MD  sulfamethoxazole-trimethoprim (BACTRIM DS,SEPTRA DS) 800-160 MG per tablet Take 1 tablet by mouth 2 (two) times daily.   Yes Historical Provider, MD  tamsulosin (FLOMAX) 0.4 MG CAPS capsule Take 1 capsule (0.4 mg total) by mouth daily. 11/29/14  Yes Altamese Dilling, MD  torsemide (DEMADEX) 20 MG tablet Take 40 mg by mouth every morning.   Yes Historical Provider, MD  traMADol (ULTRAM) 50 MG tablet Take 1 tablet (50 mg total) by mouth every 6 (six) hours as needed for moderate pain. 11/29/14  Yes Altamese Dilling, MD  traZODone (DESYREL) 100 MG tablet Take by mouth. 04/09/14  Yes Historical Provider, MD  warfarin (COUMADIN) 1 MG tablet Take 1 mg by mouth daily.   Yes Historical Provider, MD  warfarin (COUMADIN) 2.5 MG tablet Take 2.5 mg by mouth daily.   Yes Historical Provider, MD  warfarin (COUMADIN) 6 MG tablet Take 6 mg by mouth at bedtime.    Yes Historical Provider, MD  alum & mag hydroxide-simeth (MAALOX/MYLANTA) 200-200-20 MG/5ML suspension Take 30 mLs by mouth every 4 (four) hours as needed for indigestion. Patient not taking: Reported on 02/10/2015 11/29/14   Altamese Dilling, MD  amLODipine (NORVASC) 5 MG tablet Take 1 tablet (5 mg total) by mouth daily. Patient not taking: Reported on 02/10/2015 11/29/14   Altamese Dilling, MD  bisacodyl (DULCOLAX) 10 MG suppository Place 1 suppository (10 mg total) rectally daily as needed for moderate constipation. Patient not taking: Reported on 02/04/2015 11/29/14   Altamese Dilling, MD  enoxaparin (LOVENOX) 30 MG/0.3ML injection Inject 0.3 mLs (30 mg total) into the skin every 12 (twelve) hours. 11/29/14 12/02/14  Altamese Dilling, MD  menthol-cetylpyridinium (CEPACOL) 3 MG lozenge Take 1 lozenge (3 mg total) by mouth as needed for sore throat (sore throat). Patient not taking: Reported on 02/04/2015 11/29/14   Altamese Dilling, MD    Inpatient Medications:  . antiseptic oral rinse  7 mL Mouth Rinse q12n4p  . chlorhexidine  15 mL Mouth Rinse BID  . donepezil  10 mg Oral q morning -  10a  . insulin aspart  0-5 Units Subcutaneous QHS  . insulin aspart  0-9 Units Subcutaneous TID WC  . insulin aspart protamine- aspart  10 Units Subcutaneous BID WC  . isosorbide dinitrate  10 mg Oral BID  . levothyroxine  112 mcg Oral q morning - 10a  . memantine  10 mg Oral BID  . methylPREDNISolone (SOLU-MEDROL) injection  60 mg Intravenous 3 times per day  . pantoprazole  40 mg Oral Daily  . senna-docusate  1 tablet Oral Daily  . sertraline  25 mg Oral q morning - 10a  . sevelamer carbonate  800 mg Oral TID WC  . simvastatin  20 mg Oral QHS  . sodium chloride  3 mL Intravenous Q12H  . spironolactone  12.5 mg Oral q morning - 10a  . tamsulosin  0.4 mg Oral Daily  . torsemide  40 mg Oral q morning - 10a  . traZODone  100 mg Oral QHS   . furosemide (LASIX) infusion 10 mg/hr  (02/10/15 1122)    Allergies:  Allergies  Allergen Reactions  . Ambien [Zolpidem Tartrate]   . Erythromycin Other (See Comments)    Reaction:  Unknown   . Penicillins Swelling    Social History   Social History  . Marital Status: Married    Spouse Name: N/A  . Number of Children: N/A  . Years of Education: N/A   Occupational History  . Not on file.   Social History Main Topics  . Smoking status: Former Games developer  . Smokeless tobacco: Not on file     Comment: quit 1990  . Alcohol Use: No  . Drug Use: No  . Sexual Activity: Not on file   Other Topics Concern  . Not on file   Social History Narrative     Family History  Problem Relation Age of Onset  . Kidney disease Neg Hx   . Prostate cancer Neg Hx   . Bladder Cancer Neg Hx   . Hypertension Other      Review of Systems Positive for unable to give appropriate comments  .  Labs:  Recent Labs  02/09/15 2331 02/10/15 0215 02/10/15 0745  TROPONINI 0.07* 0.08* 0.09*   Lab Results  Component Value Date   WBC 9.3 02/10/2015   HGB 9.4* 02/10/2015   HCT 30.0* 02/10/2015   MCV 82.5 02/10/2015   PLT 154 02/10/2015    Recent Labs Lab 02/10/15 0215  NA 137  K 4.8  CL 102  CO2 22  BUN 89*  CREATININE 5.01*  CALCIUM 8.1*  PROT 6.7  BILITOT 0.4  ALKPHOS 73  ALT 18  AST 36  GLUCOSE 140*   No results found for: CHOL, HDL, LDLCALC, TRIG No results found for: DDIMER  Radiology/Studies:  Dg Chest Portable 1 View  02/10/2015   CLINICAL DATA:  Shortness of breath.  Cough.  EXAM: PORTABLE CHEST - 1 VIEW  COMPARISON:  11/25/2014  FINDINGS: Postoperative changes in the mediastinum. Cardiac pacemaker. Cardiac enlargement with mild pulmonary vascular congestion. Blunting of the costophrenic angles suggesting bilateral effusions. Hazy perihilar opacities may indicate edema. No pneumothorax. Mediastinal contours appear intact. Linear shadow along the right chest wall probably represents skin fold.  IMPRESSION:  Cardiac enlargement with pulmonary vascular congestion, probable perihilar edema, and small bilateral pleural effusions.   Electronically Signed   By: Burman Nieves M.D.   On: 02/10/2015 00:10    EKG: Atrial fibrillation  Weights: Filed Weights   02/09/15 2315 02/10/15 0159  Weight: 190 lb (86.183 kg) 190 lb 8 oz (86.41 kg)     Physical Exam: Blood pressure 125/59, pulse 72, temperature 98 F (36.7 C), temperature source Oral, resp. rate 16, height 5\' 9"  (1.753 m), weight 190 lb 8 oz (86.41 kg), SpO2 95 %. Body mass index is 28.12 kg/(m^2). General: Well developed, well nourished, in no acute distress. Head eyes ears nose throat: Normocephalic, atraumatic, sclera non-icteric, no xanthomas, nares are without discharge. No apparent thyromegaly and/or mass  Lungs: Normal respiratory effort. Few diffuse wheezes, basilar rales, no rhonchi.  Heart: Irregular with normal S1 S2. 2+ apical murmur gallop, no rub, PMI is normal size and placement, carotid upstroke normal without bruit, jugular venous pressure is normal Abdomen: Soft, non-tender, non-distended with normoactive bowel sounds. No hepatomegaly. No rebound/guarding. No obvious abdominal masses. Abdominal aorta is normal size without bruit Extremities: 1+ edema. no cyanosis, no clubbing, no ulcers  Peripheral : 2+ bilateral upper extremity pulses, 2+ bilateral femoral pulses, 2+ bilateral dorsal pedal pulse Neuro: Not Alert and oriented. No facial asymmetry. No focal deficit. Moves all extremities spontaneously. Musculoskeletal: Normal muscle tone without kyphosis     Assessment: 81 year old male with known coronary artery disease status post multiple myocardial infarction and ICD placement diabetes with complication chronic atrial fibrillation nonvalvular in nature with acute on chronic systolic dysfunction congestive heart failure and progressive symptoms without evidence of significant myocardial infarction  Plan: 1. Intravenous  Lasix gently to reduce the possibility of worsening pulmonary edema and following closely for worsening chronic kidney disease 2. Echocardiogram for LV systolic dysfunction and changes structurally to assess need in adjustments of medications 3. Isosorbide for venous capacitance and any chest pain 4. Discontinuation of Aldactone for possible reduction in hyperkalemia and chronic kidney disease exacerbation 5. Further adjustments of medication management after above  Signed, Lamar Blinks M.D. [facility] Cardiology 02/10/2015, 5:23 PM

## 2015-02-11 LAB — BASIC METABOLIC PANEL
Anion gap: 12 (ref 5–15)
BUN: 95 mg/dL — AB (ref 6–20)
CALCIUM: 8.2 mg/dL — AB (ref 8.9–10.3)
CO2: 22 mmol/L (ref 22–32)
CREATININE: 5.18 mg/dL — AB (ref 0.61–1.24)
Chloride: 101 mmol/L (ref 101–111)
GFR calc non Af Amer: 9 mL/min — ABNORMAL LOW (ref >60)
GFR, EST AFRICAN AMERICAN: 10 mL/min — AB (ref >60)
Glucose, Bld: 187 mg/dL — ABNORMAL HIGH (ref 65–99)
Potassium: 5.7 mmol/L — ABNORMAL HIGH (ref 3.5–5.1)
SODIUM: 135 mmol/L (ref 135–145)

## 2015-02-11 LAB — GLUCOSE, CAPILLARY
GLUCOSE-CAPILLARY: 214 mg/dL — AB (ref 65–99)
Glucose-Capillary: 197 mg/dL — ABNORMAL HIGH (ref 65–99)
Glucose-Capillary: 281 mg/dL — ABNORMAL HIGH (ref 65–99)
Glucose-Capillary: 206 mg/dL — ABNORMAL HIGH (ref 65–99)

## 2015-02-11 MED ORDER — FUROSEMIDE 10 MG/ML IJ SOLN
10.0000 mg/h | INTRAVENOUS | Status: AC
  Administered 2015-02-11: 10 mg/h via INTRAVENOUS
  Filled 2015-02-11: qty 25

## 2015-02-11 MED ORDER — INSULIN ASPART PROT & ASPART (70-30 MIX) 100 UNIT/ML ~~LOC~~ SUSP
12.0000 [IU] | Freq: Two times a day (BID) | SUBCUTANEOUS | Status: DC
  Administered 2015-02-11 – 2015-02-13 (×4): 12 [IU] via SUBCUTANEOUS
  Filled 2015-02-11 (×4): qty 12

## 2015-02-11 MED ORDER — COLLAGENASE 250 UNIT/GM EX OINT
TOPICAL_OINTMENT | Freq: Every day | CUTANEOUS | Status: DC
  Administered 2015-02-11 – 2015-02-13 (×3): via TOPICAL
  Filled 2015-02-11: qty 30

## 2015-02-11 MED ORDER — METHYLPREDNISOLONE SODIUM SUCC 125 MG IJ SOLR
60.0000 mg | Freq: Two times a day (BID) | INTRAMUSCULAR | Status: DC
  Administered 2015-02-12 – 2015-02-13 (×3): 60 mg via INTRAVENOUS
  Filled 2015-02-11 (×3): qty 2

## 2015-02-11 MED ORDER — POLYETHYLENE GLYCOL 3350 17 G PO PACK
17.0000 g | PACK | Freq: Two times a day (BID) | ORAL | Status: DC
  Administered 2015-02-12: 17 g via ORAL
  Filled 2015-02-11: qty 1

## 2015-02-11 MED ORDER — HALOPERIDOL LACTATE 5 MG/ML IJ SOLN: 5.0000 mg | Freq: Once | INTRAMUSCULAR | Status: DC

## 2015-02-11 MED ORDER — SENNOSIDES-DOCUSATE SODIUM 8.6-50 MG PO TABS
2.0000 | ORAL_TABLET | Freq: Two times a day (BID) | ORAL | Status: DC
  Administered 2015-02-11 – 2015-02-13 (×4): 2 via ORAL
  Filled 2015-02-11 (×4): qty 2

## 2015-02-11 MED ORDER — HALOPERIDOL LACTATE 5 MG/ML IJ SOLN
5.0000 mg | Freq: Once | INTRAMUSCULAR | Status: AC
  Administered 2015-02-11: 5 mg via INTRAVENOUS
  Filled 2015-02-11: qty 1

## 2015-02-11 NOTE — Progress Notes (Signed)
Patient alert and oriented x4, no complaints at this time. vss at this time. Patient V paced on telemetry. Will continue to assess. Dressings on bilateral feet changed.  Trudee Kuster

## 2015-02-11 NOTE — Progress Notes (Signed)
[facility] - [place] at [facility]   PATIENT NAME: Tim Myers    MR#:  [id number]  DATE OF BIRTH:  1924/01/07  SUBJECTIVE:  CHIEF COMPLAINT:   Chief Complaint  Patient presents with  . Shortness of Breath  Seems very short of breath, has generalized anasarca.  Son and other family members at bedside, patient is hard of hearing REVIEW OF SYSTEMS:  Review of Systems  Constitutional: Negative for fever, weight loss, malaise/fatigue and diaphoresis.  HENT: Negative for ear discharge, ear pain, hearing loss, nosebleeds, sore throat and tinnitus.   Eyes: Negative for blurred vision and pain.  Respiratory: Positive for shortness of breath. Negative for cough, hemoptysis and wheezing.   Cardiovascular: Negative for chest pain, palpitations, orthopnea and leg swelling.  Gastrointestinal: Negative for heartburn, nausea, vomiting, abdominal pain, diarrhea, constipation and blood in stool.  Genitourinary: Negative for dysuria, urgency and frequency.  Musculoskeletal: Negative for myalgias and back pain.  Skin: Negative for itching and rash.  Neurological: Negative for dizziness, tingling, tremors, focal weakness, seizures, weakness and headaches.  Psychiatric/Behavioral: Negative for depression. The patient is not nervous/anxious.    DRUG ALLERGIES:   Allergies  Allergen Reactions  . Ambien [Zolpidem Tartrate]   . Erythromycin Other (See Comments)    Reaction:  Unknown   . Penicillins Swelling   VITALS:  Blood pressure 111/58, pulse 75, temperature 97.3 F (36.3 C), temperature source Oral, resp. rate 20, height  (1.753 m), weight 87.045 kg (191 lb 14.4 oz), SpO2 95 %. PHYSICAL EXAMINATION:  Physical Exam  Constitutional: He is oriented to person, place, and time and well-developed, well-nourished, and in no distress.  HENT:  Head: Normocephalic and atraumatic.  Eyes: Conjunctivae and EOM are normal. Pupils are equal, round, and reactive to light.  Neck:  Normal range of motion. Neck supple. No tracheal deviation present. No thyromegaly present.  Cardiovascular: Normal rate, regular rhythm and normal heart sounds.   Pulmonary/Chest: Effort normal and breath sounds normal. No respiratory distress. He has no wheezes. He exhibits no tenderness.  Abdominal: Soft. Bowel sounds are normal. He exhibits no distension. There is no tenderness.  Musculoskeletal: Normal range of motion. He exhibits edema.  Neurological: He is alert and oriented to person, place, and time. No cranial nerve deficit.  Skin: Skin is warm and dry. No rash noted.  Bilateral pressure ulcers on heels  Psychiatric: Mood and affect normal.   LABORATORY PANEL:   CBC  Recent Labs Lab 02/10/15 0215  WBC 9.3  HGB 9.4*  HCT 30.0*  PLT 154   ------------------------------------------------------------------------------------------------------------------ Chemistries   Recent Labs Lab 02/10/15 0215 02/11/15 0633  NA 137 135  K 4.8 5.7*  CL 102 101  CO2 22 22  GLUCOSE 140* 187*  BUN 89* 95*  CREATININE 5.01* 5.18*  CALCIUM 8.1* 8.2*  AST 36  --   ALT 18  --   ALKPHOS 73  --   BILITOT 0.4  --    ASSESSMENT AND PLAN:  81 year old Caucasian gentleman history of coronary artery disease status post bypass, AICD placement, systolic congestive heart failure presenting with shortness of breath.  1. Acute on chronic systolic congestive heart failure: continue telemetry, follow ins and outs, daily weights, DuoNeb treatment, diuresis, pending consult to cardiology, has generalised anasarca. lasix drip started y'day but still UOP is only showing -370 cc which is likely inaccurate as it was about same y'day without lasix drip. Shared my concern with nurse and unit director the importance of  accurate I's and O's on this patient  2. Acute kidney injury: Continue diuresis, follow urine output/renal function. Hopefully with diuresis - improve cardiac output and can hopefully improve  renal function. Hold nephrotoxic agents.   3. Atrial fibrillation chronic: Currently V-paced, INR 4.1, holding warfarin but will have discussion with family before D/C to see if he is even a good candidate in long run.  4. Generalized anasarca: likely multifactorial, with severe protein calorie malnutrition and cardiorenal syndrome, trial of lasix drip.  5. Hypothyroidism unspecified: continue Synthroid  6. Venous thromboembolism prophylactic: INR 4.1   All the records are reviewed and case discussed with Care Management/Social Worker. Management plans discussed with the patient, family and they are in agreement.  CODE STATUS: DNR  He remains at very high risk for cardio respiratory failure and multiorgan failure and possible death.  He seems very appropriate for hospice likely hospice home.  This was discussed with patient's son hospice liaison, who are all in agreement at this point but would like to give some time, possibly over the weekend and make final decision on Monday to decide hospice home placement if no improvement  TOTAL TIME (Critical Care)TAKING CARE OF THIS PATIENT: 35 minutes.   More than 50% of the time was spent in counseling/coordination of care: YES  POSSIBLE D/C EARLY NEXT WEEK, DEPENDING ON CLINICAL CONDITION.  Had a long discussion with family, they are in agreement with aggressive diuresis at this point to see if he has any improvement, if not, they are also open to consider hospice HOME.  We will wait over the weeked before looking into hospice home.   [facility], Kema Santaella M.D on 02/11/2015 at 12:47 PM  Between 7am to 6pm - Pager - [phone]  After 6pm go to [email or web address] - password [email or web address] [facility]  [place] [facility]  Office  [phone]  CC:  Primary care physician; Lauro Regulus., MD

## 2015-02-11 NOTE — Progress Notes (Signed)
[facility] Encounter Note  Patient: Tim Myers / Admit Date: 02/09/2015 / Date of Encounter: 02/11/2015, 5:03 PM   Subjective: Patient is not responsive at this time  Review of Systems:  Unable to assess Objective: Telemetry: Paced rhythm Physical Exam: Blood pressure 111/58, pulse 75, temperature 97.3 F (36.3 C), temperature source Oral, resp. rate 20, height  (1.753 m), weight 191 lb 14.4 oz (87.045 kg), SpO2 95 %. Body mass index is 28.33 kg/(m^2). General: Disheveled and no responses Head: Normocephalic, atraumatic, sclera non-icteric, no xanthomas, nares are without discharge. Neck: No apparent masses Lungs: Normal respirations with diffuse wheezes, many rhonchi, no rales , few crackles   Heart: Irregular rate and rhythm, normal S1 S2, 2+ mitral murmur, no rub, no gallop, PMI is normal size and placement, carotid upstroke normal without bruit, jugular venous pressure normal Abdomen: Soft, non-tender, non-distended with normoactive bowel sounds. No hepatosplenomegaly. Abdominal aorta is normal size without bruit Extremities: 1+ edema, no clubbing, no cyanosis, no ulcers,  Peripheral: 2+ radial, 2+ femoral, 2+ dorsal pedal pulses Neuro: Alert and oriented. Moves all extremities spontaneously.    Intake/Output Summary (Last 24 hours) at 02/11/15 1703 Last data filed at 02/11/15 1313  Gross per 24 hour  Intake    720 ml  Output    550 ml  Net    170 ml    Inpatient Medications:  . antiseptic oral rinse  7 mL Mouth Rinse q12n4p  . chlorhexidine  15 mL Mouth Rinse BID  . collagenase   Topical Daily  . donepezil  10 mg Oral q morning - 10a  . haloperidol lactate  5 mg Intravenous Once  . insulin aspart  0-5 Units Subcutaneous QHS  . insulin aspart  0-9 Units Subcutaneous TID WC  . insulin aspart protamine- aspart  12 Units Subcutaneous BID WC  . isosorbide dinitrate  10 mg Oral BID  . levothyroxine  112 mcg Oral q morning - 10a  . memantine  10  mg Oral BID  . [START ON 02/12/2015] methylPREDNISolone (SOLU-MEDROL) injection  60 mg Intravenous Q12H  . pantoprazole  40 mg Oral Daily  . polyethylene glycol  17 g Oral BID  . senna-docusate  2 tablet Oral BID  . sertraline  25 mg Oral q morning - 10a  . sevelamer carbonate  800 mg Oral TID WC  . simvastatin  20 mg Oral QHS  . sodium chloride  3 mL Intravenous Q12H  . tamsulosin  0.4 mg Oral Daily  . torsemide  40 mg Oral q morning - 10a  . traZODone  100 mg Oral QHS   Infusions:  . furosemide (LASIX) infusion 10 mg/hr (02/11/15 1256)    Labs:  Recent Labs  02/10/15 0215 02/11/15 0633  NA 137 135  K 4.8 5.7*  CL 102 101  CO2 22 22  GLUCOSE 140* 187*  BUN 89* 95*  CREATININE 5.01* 5.18*  CALCIUM 8.1* 8.2*    Recent Labs  02/10/15 0215  AST 36  ALT 18  ALKPHOS 73  BILITOT 0.4  PROT 6.7  ALBUMIN 3.2*    Recent Labs  02/09/15 2323 02/10/15 0215  WBC 9.2 9.3  HGB 9.2* 9.4*  HCT 29.8* 30.0*  MCV 82.2 82.5  PLT 147* 154    Recent Labs  02/09/15 2331 02/10/15 0215 02/10/15 0745  TROPONINI 0.07* 0.08* 0.09*   Invalid input(s): POCBNP No results for input(s): HGBA1C in the last 72 hours.   Weights: [employer]  02/09/15 2315 02/10/15 0159 02/11/15 0500  Weight: 190 lb (86.183 kg) 190 lb 8 oz (86.41 kg) 191 lb 14.4 oz (87.045 kg)     Radiology/Studies:  Dg Chest Portable 1 View  02/10/2015   CLINICAL DATA:  Shortness of breath.  Cough.  EXAM: PORTABLE CHEST - 1 VIEW  COMPARISON:  11/25/2014  FINDINGS: Postoperative changes in the mediastinum. Cardiac pacemaker. Cardiac enlargement with mild pulmonary vascular congestion. Blunting of the costophrenic angles suggesting bilateral effusions. Hazy perihilar opacities may indicate edema. No pneumothorax. Mediastinal contours appear intact. Linear shadow along the right chest wall probably represents skin fold.  IMPRESSION: Cardiac enlargement with pulmonary vascular congestion, probable perihilar edema,  and small bilateral pleural effusions.   Electronically Signed   By: Burman Nieves M.D.   On: 02/10/2015 00:10     Assessment and Recommendation  81 y.o. male with known coronary artery disease and LV systolic dysfunction with diabetes chronic nonvalvular atrial fibrillation with acute on chronic systolic dysfunction congestive heart failure most consistent with the acute renal failure causing volume overload and respiratory failure with bronchitis and possible aspiration now with no evidence of significant improvements of renal failure and therefore multiorgan failure needing further treatment 1. Discussed with family that the patient may not have the significant improvements with medication management and therefore hospice may be an appropriate track 2. Call if further questions  Signed, Arnoldo Hooker M.D. FACC

## 2015-02-11 NOTE — Care Management (Signed)
Patient is currently on lasix drip and decision has been made to continue this until Monday to see if patient's condition improves.  If not, may make decision to receive hospice services.  Patient does have an internal defibrillator.  Discussed with attending.

## 2015-02-11 NOTE — Plan of Care (Signed)
Problem: SLP Dysphagia Goals Goal: Misc Dysphagia Goal Pt will safely tolerate po diet of least restrictive consistency w/ no overt s/s of aspiration noted by Staff/pt/family x3 sessions.    

## 2015-02-11 NOTE — Consult Note (Signed)
WOC wound consult note Reason for Consult:Bilateral heel ulcers, Unstageable due to the presence of necrotic tissue Wound type:Pressure Pressure Ulcer POA: Yes Measurement:Right heel with 4cm x 3cm ulcer 75% obscured by the presence of black eschar.  25% red, moist.  Left heel with 1.5cm x 1cm full thickness ulcer (unstageable) the base of which is obscured by the presence of yellow slough (firmly adherent). No exudate. Wound bed:As described above. Drainage (amount, consistency, odor) As described above. Periwound: Intact, clear Dressing procedure/placement/frequency: I have today provided pressure redistribution heel boots to off-load the areas of ulceration and wound care orders for the enzymatic debridement of the bilateral heel necrotic tissue with collagenase (Santyl) to be performed once daily.  Thank you for this consultation.  The WOC nursing team will not follow, but will remain available to this patient, the nursing and medical team.  Please re-consult if needed. Thanks, Ladona Mow, MSN, RN, GNP, [place], CWON-AP [phone])

## 2015-02-11 NOTE — Evaluation (Signed)
Clinical/Bedside Swallow Evaluation Patient Details  Name: Tim Myers MRN: [id number] Date of Birth: February 14, 1924  Today's Date: 02/11/2015 Time: SLP Start Time (ACUTE ONLY): 1005 SLP Stop Time (ACUTE ONLY): 1100 SLP Time Calculation (min) (ACUTE ONLY): 55 min  Past Medical History:  Past Medical History  Diagnosis Date  . Diabetes mellitus without complication   . Hypertension   . Dementia   . A-fib   . Chronic systolic CHF (congestive heart failure)   . WPW (Wolff-Parkinson-White syndrome)   . PAD (peripheral artery disease)   . Hypothyroidism   . CAD (coronary artery disease)   . Alzheimer disease   . Depression   . Hyperlipidemia   . Pneumonia   . Wheezing   . Gait abnormality   . Weakness   . GERD (gastroesophageal reflux disease)   . Chronic kidney disease   . Enlarged prostate   . Iron deficiency anemia   . Vitamin D deficiency    Past Surgical History:  Past Surgical History  Procedure Laterality Date  . Coronary artery bypass graft    . Abdominal aortic aneurysm repair    . Pacemaker placement      defib/pacemaker  . Implantable cardioverter defibrillator implant    . Femur im nail Right 11/26/2014    Procedure: INTRAMEDULLARY (IM) NAIL FEMORAL;  Surgeon: Juanell Fairly, MD;  Location: [facility] ORS;  Service: Orthopedics;  Laterality: Right;  femur   HPI:  Pt is a 81 y.o. male with a known history of systolic congestive heart failure status post AICD placement, Wolff-Parkinson-White status post ablation, chronic kidney disease baseline creatinine around 2.5 presenting with shortness of breath from his nursing facility. He was noted to have worsening kidney function so for the last 2 days he has received IV fluids. He is now presenting with shortness of breath, wheezing for 1 day duration. Pt has a h/o DM, HTN, pneumonia, Dementia, GERD, depression. Family member reported pt exhibiting overt s/s of aspiration - coughing when drinking liquids, coughing during meals. Pt  was coughing when attempting to drink liquids in room post admission. Pt is currently NPO.    Assessment / Plan / Recommendation Clinical Impression  Pt appears to present w/ increased risk for aspiration w/ trials of thin liquids; overt s/s of aspiration noted during trials and increased WOB noted w/ continued trials. When given trials of Honey consistency liquids and purees w/ intermittent rest breaks, pt presented w/ an adequate oropharyngeal phase swallow function w/ no overt s/s of aspiration(no coughing or throat clearing) w/ all trials given. Pt exhibited a clear vocal quality b/t trials. Pt exhibited no significant oral phase deficits w/ po trials assessed. Pt manipulated boluses adequately for A-P transfer and swallowing. Pt required assistance in feeding himself - support given. Pt would benefit from a dysphagia at this time; strict aspiration precautions; meds crused in puree as nec/able. Tray setup at meals and support w/ feeding/drinking.     Aspiration Risk  Mild (-moderate)    Diet Recommendation Dysphagia 1 (Puree);Honey   Medication Administration: Crushed with puree Compensations: Slow rate;Small sips/bites (rest breaks to avoid increased WOB)    Other  Recommendations Recommended Consults:  (dietician; palliative care ) Oral Care Recommendations: Oral care BID;Staff/trained caregiver to provide oral care Other Recommendations: Order thickener from pharmacy;Prohibited food (jello, ice cream, thin soups);Remove water pitcher   Follow Up Recommendations       Frequency and Duration min 3x week  1 week   Pertinent Vitals/Pain denied  SLP Swallow Goals  see care plan   Swallow Study Prior Functional Status   declined medical/respiratory status prior to admission per MD. Dysphagia suspected per family report of incidents at home prior to admission.     General Date of Onset: 02/09/15 Other Pertinent Information: Pt is a 81 y.o. male with a known history of systolic  congestive heart failure status post AICD placement, Wolff-Parkinson-White status post ablation, chronic kidney disease baseline creatinine around 2.5 presenting with shortness of breath from his nursing facility. He was noted to have worsening kidney function so for the last 2 days he has received IV fluids. He is now presenting with shortness of breath, wheezing for 1 day duration. Pt has a h/o DM, HTN, pneumonia, Dementia, GERD, depression. Family member reported pt exhibiting overt s/s of aspiration - coughing when drinking liquids, coughing during meals. Pt was coughing when attempting to drink liquids in room post admission. Pt is currently NPO.  Type of Study: Bedside swallow evaluation Previous Swallow Assessment: none Diet Prior to this Study: Regular;Thin liquids Temperature Spikes Noted: No (wbc 9.3 yesterday) Respiratory Status: Supplemental O2 delivered via (comment) (4 liters) History of Recent Intubation: No Behavior/Cognition: Alert;Confused;Agitated;Distractible;Requires cueing Oral Cavity - Dentition: Missing dentition Self-Feeding Abilities: Needs assist;Total assist Patient Positioning: Upright in bed Baseline Vocal Quality: Breathy;Low vocal intensity (dysphonia) Volitional Cough: Congested Volitional Swallow: Able to elicit    Oral/Motor/Sensory Function Overall Oral Motor/Sensory Function:  (unable to fully assess but appeared grossly wfl w/ po's) Labial Symmetry: Within Functional Limits Lingual ROM: Within Functional Limits Lingual Symmetry: Within Functional Limits Facial Symmetry: Within Functional Limits   Ice Chips Ice chips: Within functional limits Presentation: Spoon (fed; x2 trials) Oral Phase Impairments:  (none) Pharyngeal Phase Impairments:  (none)   Thin Liquid Thin Liquid: Impaired Presentation: Cup (assisted; 4 trials) Oral Phase Impairments:  (none) Oral Phase Functional Implications:  (none) Pharyngeal  Phase Impairments: Cough - Delayed (x2  trials)    Nectar Thick Nectar Thick Liquid: Not tested   Honey Thick Honey Thick Liquid: Within functional limits Presentation: Cup;Self fed (3 ozs)   Puree Puree: Within functional limits Presentation: Spoon (fed; 6 trials)   Solid   GO    Solid: Not tested      Jerilynn Som, MS, CCC-SLP  Keavon Sensing 02/11/2015,2:10 PM

## 2015-02-11 NOTE — Progress Notes (Signed)
Initial Nutrition Assessment  INTERVENTION:   Meals and Snacks: Cater to patient preferences Medical Food Supplement Therapy: recommend addition of Honey Thick Mighty Shakes TID with meals as well as Magic Cup supplement   NUTRITION DIAGNOSIS:   Inadequate oral intake related to dysphagia, poor appetite as evidenced by per patient/family report.  GOAL:   Patient will meet greater than or equal to 90% of their needs  MONITOR:    (Energy Intake, Anthropometrics, Electrolyte/Renal Profile, Digestive System)  REASON FOR ASSESSMENT:   Diagnosis    ASSESSMENT:    Pt admitted with acute on chronic CHF, AKI; pt SOB, generalized anasarca; per MD note, noted pt appropriate for hospice, possibly hospice home  Past Medical History  Diagnosis Date  . Diabetes mellitus without complication   . Hypertension   . Dementia   . A-fib   . Chronic systolic CHF (congestive heart failure)   . WPW (Wolff-Parkinson-White syndrome)   . PAD (peripheral artery disease)   . Hypothyroidism   . CAD (coronary artery disease)   . Alzheimer disease   . Depression   . Hyperlipidemia   . Pneumonia   . Wheezing   . Gait abnormality   . Weakness   . GERD (gastroesophageal reflux disease)   . Chronic kidney disease   . Enlarged prostate   . Iron deficiency anemia   . Vitamin D deficiency      Diet Order:  DIET - DYS 1 Room service appropriate?: Yes; Fluid consistency:: Honey Thick   Energy Intake: recorded po intake 25% at lunch, NPO at breakfast this AM  Food and Nutrition related history: family reports appetite has been down, pt has been drinking Ensure supplements  Electrolyte and Renal Profile:  Recent Labs Lab 02/09/15 2323 02/10/15 0215 02/11/15 0633  BUN 84* 89* 95*  CREATININE 4.79* 5.01* 5.18*  NA 137 137 135  K 5.2* 4.8 5.7*   Glucose Profile:   Recent Labs  02/10/15 2018 02/11/15 0753 02/11/15 1114  GLUCAP 158* 214* 197*   Meds: ss novolog, lasix, haldol,  solumedrol  Nutrition Focused Physical Exam:  Unable to complete Nutrition-Focused physical exam at this time.    Last BM:  8/25  Height:   Ht Readings from Last 1 Encounters:  02/09/15  (1.753 m)    Weight: family reports UBW around 162 pounds, report recent weight gain due to fluid  Wt Readings from Last 1 Encounters:  02/11/15 191 lb 14.4 oz (87.045 kg)    Ideal Body Weight:     BMI:  Body mass index is 28.33 kg/(m^2).  Estimated Nutritional Needs:   Kcal:  1825-2190 kcals using IBW 73 kg  Protein:  73-88 g (1.0-1.2 g/kg)   Fluid:  1825-2190 mL  HIGH Care Level  Romelle Starcher MS, RD, LDN [phone] Pager

## 2015-02-11 NOTE — Progress Notes (Signed)
Inpatient Diabetes Program Recommendations  AACE/ADA: New Consensus Statement on Inpatient Glycemic Control (2013)  Target Ranges:  Prepandial:   less than 140 mg/dL      Peak postprandial:   less than 180 mg/dL (1-2 hours)      Critically ill patients:  140 - 180 mg/dL   Results for ZEN, Tim Myers (MRN [id number]) as of 02/11/2015 12:23  Ref. Range 02/10/2015 07:24 02/10/2015 12:06 02/10/2015 16:30 02/10/2015 20:18 02/11/2015 07:53 02/11/2015 11:14  Glucose-Capillary Latest Ref Range: 65-99 mg/dL 829 (H) 562 (H) 130 (H) 158 (H) 214 (H) 197 (H)    Diabetes history: DM2 Outpatient Diabetes medications: 70/30 15 units QAM with breakfast, 70/30 10 units QPM with supper Current orders for Inpatient glycemic control: 70/30 10 units BID with meals, Novolog 0-9 units TID with meals, Novolog 0-5 units HS  Inpatient Diabetes Program Recommendations Insulin - Basal: Please consider increasing 70/30 to 12 units BID.  Thanks, Orlando Penner, RN, MSN, CCRN, CDE Diabetes Coordinator Inpatient Diabetes Program [phone] (Team Pager from 8am to 5pm) [phone] (AP office) [phone] [facility] office) [phone] [facility] office)

## 2015-02-11 NOTE — Progress Notes (Signed)
At the request of Dr. Manuella Ghazi, writer met with patient's family in the family room to discuss hospice services, both at home and the hospice home. The meeting included: patient's wife Manuela Schwartz, son Laverna Peace and daughter Josephina Shih, step son Merry Proud and his wife Sharee Pimple and sister in [place].  Patient was admitted on 8/25 for evaluation of worsening shortness of breath and wheezing,  from [facility] where he had been for rehab. He was previously hospitalized in June 2016 after a fall resulting in a right hip fracture for which he under went a closed reduction, he was then discharged to SNF for rehab. Per chart notes and family report he has continued to have a steady decline in his mental and functional status. He has become bed bound and has developed pressure ulcers to his heels bilaterally. Family reports he was also having difficulty swallowing liquids yesterday (8/25). He is currently requiring oxygen at 4 liters with oxygen saturations in the mid to low 90's.  Per conversation with Dr. Manuella Ghazi, patient's renal function has declined significantly since admission in June and he now has anasarca despite being on a lasix drip.  After a detailed discussion, the family has decided to continue treatment through the weekend and reassess with attending physician and writer on Monday. Of note patient currently has a pacemaker defibrillator which remains active, this WAS NOT addressed during our conversation regarding the hospice home.  This will need to be addressed prior to transfer to the hospice home. He is a DNR code.  Information faxed to referral intake, hospice information left with patient's wife Manuela Schwartz. Thank you for the opportunity to participate in the care of Mr. Tim Myers.  Flo Shanks RN, BSN, [place] [facility] [place], [facility] [phone] c

## 2015-02-12 LAB — BASIC METABOLIC PANEL
ANION GAP: 14 (ref 5–15)
BUN: 101 mg/dL — ABNORMAL HIGH (ref 6–20)
CHLORIDE: 100 mmol/L — AB (ref 101–111)
CO2: 19 mmol/L — ABNORMAL LOW (ref 22–32)
Calcium: 7.8 mg/dL — ABNORMAL LOW (ref 8.9–10.3)
Creatinine, Ser: 5.62 mg/dL — ABNORMAL HIGH (ref 0.61–1.24)
GFR calc Af Amer: 9 mL/min — ABNORMAL LOW (ref >60)
GFR, EST NON AFRICAN AMERICAN: 8 mL/min — AB (ref >60)
Glucose, Bld: 263 mg/dL — ABNORMAL HIGH (ref 65–99)
POTASSIUM: 6.1 mmol/L — AB (ref 3.5–5.1)
SODIUM: 133 mmol/L — AB (ref 135–145)

## 2015-02-12 LAB — GLUCOSE, CAPILLARY
GLUCOSE-CAPILLARY: 196 mg/dL — AB (ref 65–99)
GLUCOSE-CAPILLARY: 295 mg/dL — AB (ref 65–99)
GLUCOSE-CAPILLARY: 213 mg/dL — AB (ref 65–99)
Glucose-Capillary: 200 mg/dL — ABNORMAL HIGH (ref 65–99)

## 2015-02-12 MED ORDER — SODIUM POLYSTYRENE SULFONATE 15 GM/60ML PO SUSP
30.0000 g | ORAL | Status: AC
  Administered 2015-02-12: 30 g via ORAL
  Filled 2015-02-12: qty 120

## 2015-02-12 MED ORDER — LORAZEPAM 2 MG/ML IJ SOLN
0.5000 mg | Freq: Once | INTRAMUSCULAR | Status: AC
  Administered 2015-02-12: 0.5 mg via INTRAVENOUS
  Filled 2015-02-12: qty 1

## 2015-02-12 NOTE — Progress Notes (Signed)
Speech Language Pathology Treatment: Dysphagia  Patient Details Name: Tim Myers MRN: [id number] DOB: 02-27-1924 Today's Date: 02/12/2015 Time: 1000-1025 SLP Time Calculation (min) (ACUTE ONLY): 25 min  Assessment / Plan / Recommendation Clinical Impression  Pt remains moderate to high risk of aspiration with liquids thinner than honey. Pt give 2 ounces of nectar thick liquids and 2 ounces of honey thick liquids by cup. Pt demonstrated intermittent wet vocal quality and delayed congested cough during trials of nectar thick liquids (2 ounces). Pt demonstrated congested cough prior to trials of nectar, however congested cough appeared to increase during trials of nectar. Pt then observed with trials of honey and no further wet vocal quality was noted and no coughing noted until after completion of trials. Recommend continue w/Dysphagia I diet w/honey thick liquids and strict aspiration precautions. Will f/u in 1-3 days with possible further trials of nectar as pt improves.    HPI Other Pertinent Information: Pt is a 81 y.o. male with a known history of systolic congestive heart failure status post AICD placement, Wolff-Parkinson-White status post ablation, chronic kidney disease baseline creatinine around 2.5 presenting with shortness of breath from his nursing facility. He was noted to have worsening kidney function so for the last 2 days he has received IV fluids. He is now presenting with shortness of breath, wheezing for 1 day duration. Pt has a h/o DM, HTN, pneumonia, Dementia, GERD, depression. Family member reported pt exhibiting overt s/s of aspiration - coughing when drinking liquids, coughing during meals. Pt was coughing when attempting to drink liquids in room post admission. Pt is currently NPO.    Pertinent Vitals Pain Assessment: No/denies pain  SLP Plan  Continue with current plan of care    Recommendations Diet recommendations: Dysphagia 1 (puree);Honey-thick liquid Liquids provided  via: Cup Medication Administration: Crushed with puree Supervision: Staff to assist with self feeding Compensations: Slow rate;Small sips/bites Postural Changes and/or Swallow Maneuvers: Seated upright 90 degrees;Upright 30-60 min after meal              Oral Care Recommendations: Oral care BID;Staff/trained caregiver to provide oral care Follow up Recommendations: Skilled Nursing facility Plan: Continue with current plan of care    GO     Tim Myers 02/12/2015, 10:33 AM

## 2015-02-12 NOTE — Progress Notes (Signed)
[facility] - [place] at [facility]   PATIENT NAME: Tim Myers    MR#:  [id number]  DATE OF BIRTH:  10-31-1923  SUBJECTIVE:  CHIEF COMPLAINT:   Chief Complaint  Patient presents with  . Shortness of Breath  Seems very short of breath, with audible gurgly breath sounds, has generalized anasarca. Left upper extremity is more swollen than the right upper extremity. Wife, daughter and other family members at bedside, patient is hard of hearing REVIEW OF SYSTEMS:  Review of Systems  Constitutional: Negative for fever, weight loss, malaise/fatigue and diaphoresis.  HENT: Negative for ear discharge, ear pain, hearing loss, nosebleeds, sore throat and tinnitus.   Eyes: Negative for blurred vision and pain.  Respiratory: Positive for cough and shortness of breath. Negative for hemoptysis and wheezing.   Cardiovascular: Negative for chest pain, palpitations, orthopnea and leg swelling.       Generalized edema  Gastrointestinal: Negative for heartburn, nausea, vomiting, abdominal pain, diarrhea, constipation and blood in stool.  Genitourinary: Negative for dysuria, urgency and frequency.  Musculoskeletal: Negative for myalgias and back pain.       Left upper extremities more swollen than right upper extremity  Skin: Negative for itching and rash.  Neurological: Negative for dizziness, tingling, tremors, focal weakness, seizures, weakness and headaches.  Psychiatric/Behavioral: Negative for depression. The patient is not nervous/anxious.    DRUG ALLERGIES:   Allergies  Allergen Reactions  . Ambien [Zolpidem Tartrate]   . Erythromycin Other (See Comments)    Reaction:  Unknown   . Penicillins Swelling   VITALS:  Blood pressure 108/54, pulse 75, temperature 97.4 F (36.3 C), temperature source Oral, resp. rate 17, height  (1.753 m), weight 88.587 kg (195 lb 4.8 oz), SpO2 100 %. PHYSICAL EXAMINATION:  Physical Exam  Constitutional: He is oriented to person,  place, and time and well-developed, well-nourished, and in no distress.  HENT:  Head: Normocephalic and atraumatic.  Eyes: Conjunctivae and EOM are normal. Pupils are equal, round, and reactive to light.  Neck: Normal range of motion. Neck supple. No tracheal deviation present. No thyromegaly present.  Cardiovascular: Normal rate, regular rhythm and normal heart sounds.   Pulmonary/Chest: Effort normal and breath sounds normal. No respiratory distress. He has no wheezes. He exhibits no tenderness.  Abdominal: Soft. Bowel sounds are normal. He exhibits no distension. There is no tenderness.  Musculoskeletal: Normal range of motion. He exhibits edema.  Neurological: He is alert and oriented to person, place, and time. No cranial nerve deficit.  Skin: Skin is warm and dry. No rash noted.  Bilateral pressure ulcers on heels  Psychiatric: Mood and affect normal.   LABORATORY PANEL:   CBC  Recent Labs Lab 02/10/15 0215  WBC 9.3  HGB 9.4*  HCT 30.0*  PLT 154   ------------------------------------------------------------------------------------------------------------------ Chemistries   Recent Labs Lab 02/10/15 0215  02/12/15 1002  NA 137  < > 133*  K 4.8  < > 6.1*  CL 102  < > 100*  CO2 22  < > 19*  GLUCOSE 140*  < > 263*  BUN 89*  < > 101*  CREATININE 5.01*  < > 5.62*  CALCIUM 8.1*  < > 7.8*  AST 36  --   --   ALT 18  --   --   ALKPHOS 73  --   --   BILITOT 0.4  --   --   < > = values in this interval not displayed. ASSESSMENT AND PLAN:  81 year old Caucasian gentleman history of coronary artery disease status post bypass, AICD placement, systolic congestive heart failure presenting with shortness of breath.  1. Acute on chronic systolic congestive heart failure with anasarca: continue telemetry, follow ins and outs, daily weights, DuoNeb treatment, diuresis, pending consult to cardiology, has generalised anasarca. lasix drip restarted today but still UOP is only showing  -350 app  which is likely inaccurate as it was about same y'day without lasix drip.  Discussed with nursing regarding accurate monitoring of intake and output  2. Acute kidney injury: Continue diuresis, follow urine output/renal function. Hopefully with diuresis - improve cardiac output and can hopefully improve renal function. Hold nephrotoxic agents.   3. Atrial fibrillation chronic: Currently V-paced, INR 4.1, holding warfarin but will have discussion with family before D/C to see if he is even a good candidate in long run. Family is considering hospice home if there is no improvement by Monday. We'll consult cardiology as  patient has pacemaker with defibrillator need to be  turned off if pt is going to hospice home  4. Generalized anasarca: likely multifactorial, with severe protein calorie malnutrition and cardiorenal syndrome, trial of lasix drip with no significant improvement  5. Hypothyroidism unspecified: continue Synthroid  6. Venous thromboembolism prophylactic: INR 4.1  7. Hyperkalemia- patient is on Lasix drip, will give Kayexalate and recheck BMP in a.m.   All the records are reviewed and case discussed with Care Management/Social Worker. Management plans discussed with the patient, family and they are in agreement.  CODE STATUS: DNR  He remains at very high risk for cardio respiratory failure and multiorgan failure and possible death.  He seems very appropriate for hospice likely hospice home.  This was discussed with patient's son hospice liaison, who are all in agreement at this point but would like to give some time, possibly over the weekend and make final decision on Monday to decide hospice home placement if no improvement  TOTAL CRITICAL CARE TIME TAKING CARE OF THIS PATIENT: 35 minutes.   More than 50% of the time was spent in counseling/coordination of care: YES  POSSIBLE D/C EARLY NEXT WEEK, DEPENDING ON CLINICAL CONDITION.  Had a long discussion with family,  they are in agreement with aggressive diuresis at this point to see if he has any improvement, if not, they are also open to consider hospice HOME.  We will wait over the weeked before looking into hospice home.   Ramonita Lab M.D on 02/12/2015 at 2:13 PM  Between 7am to 6pm - Pager - [phone]  After 6pm go to [email or web address] - password [email or web address] [facility]  [facility]  Office  [phone]  CC:  Primary care physician; Lauro Regulus., MD

## 2015-02-12 NOTE — Progress Notes (Signed)
   02/12/15 2120  Clinical Encounter Type  Visited With Patient and family together  Visit Type Initial  Referral From Nurse  Consult/Referral To Chaplain  Spiritual Encounters  Spiritual Needs Prayer  Stress Factors  Patient Stress Factors None identified  Family Stress Factors Major life changes  Chaplain visited with patient and family and offered prayer.   [employer] Shastina Rua [phone]

## 2015-02-12 NOTE — Progress Notes (Signed)
Updated wife on patient plan of care. Wife questioned DNR bracelet and stated "pt is supposed to be a full code". further explained pt plan of care to go to hospice and told wife she was able to change code status. Family upset at this time. MD notified of family stance and possible change. Charge nurse notified and spoke with family. House supervisor notified and spoke with family. Family decided at this time for patient to remain DNR. In the midst speaking with the wife CNA notified me that patient 02 saturation level was in the 60s. No signs of distress. Pt switched to non-rebreather mask. 02 saturation level above 90s and maintaining. Will remain on mask for the remainder of shift. Pt currently in no signs/symptoms of distress.

## 2015-02-12 NOTE — Progress Notes (Signed)
Patient alert and oriented x2, no complaints at this time. vss at this time. Patient vpaced on telemetry. Will continue to assess. Dressings on bilateral feet changed. Trudee Kuster

## 2015-02-13 LAB — PROTIME-INR
INR: 1.95
Prothrombin Time: 22.4 seconds — ABNORMAL HIGH (ref 11.4–15.0)

## 2015-02-13 LAB — BASIC METABOLIC PANEL
Anion gap: 14 (ref 5–15)
BUN: 104 mg/dL — AB (ref 6–20)
CALCIUM: 8.2 mg/dL — AB (ref 8.9–10.3)
CHLORIDE: 100 mmol/L — AB (ref 101–111)
CO2: 23 mmol/L (ref 22–32)
CREATININE: 5.53 mg/dL — AB (ref 0.61–1.24)
GFR calc non Af Amer: 8 mL/min — ABNORMAL LOW (ref >60)
GFR, EST AFRICAN AMERICAN: 9 mL/min — AB (ref >60)
GLUCOSE: 189 mg/dL — AB (ref 65–99)
Potassium: 5.4 mmol/L — ABNORMAL HIGH (ref 3.5–5.1)
Sodium: 137 mmol/L (ref 135–145)

## 2015-02-13 LAB — GLUCOSE, CAPILLARY: Glucose-Capillary: 229 mg/dL — ABNORMAL HIGH (ref 65–99)

## 2015-02-13 MED ORDER — MORPHINE SULFATE 20 MG/5ML PO SOLN: 5.0000 mg | ORAL | Status: AC | PRN

## 2015-02-13 NOTE — Discharge Summary (Signed)
[facility]icians -  at [facility]   PATIENT NAME: Tim Myers    MR#:  [id number]  DATE OF BIRTH:  28-Jun-1923  DATE OF ADMISSION:  02/09/2015 ADMITTING PHYSICIAN: Wyatt Haste, MD  DATE OF DISCHARGE: 02/13/2015  PRIMARY CARE PHYSICIAN: Lauro Regulus., MD    ADMISSION DIAGNOSIS:  Acute pulmonary edema [J81.0] Acute on chronic renal insufficiency [N17.9, N18.9] Acute respiratory failure with hypoxia [J96.01]  DISCHARGE DIAGNOSIS:  Active Problems:   Acute on chronic systolic congestive heart failure   Acute kidney injury   SECONDARY DIAGNOSIS:   Past Medical History  Diagnosis Date  . Diabetes mellitus without complication   . Hypertension   . Dementia   . A-fib   . Chronic systolic CHF (congestive heart failure)   . WPW (Wolff-Parkinson-White syndrome)   . PAD (peripheral artery disease)   . Hypothyroidism   . CAD (coronary artery disease)   . Alzheimer disease   . Depression   . Hyperlipidemia   . Pneumonia   . Wheezing   . Gait abnormality   . Weakness   . GERD (gastroesophageal reflux disease)   . Chronic kidney disease   . Enlarged prostate   . Iron deficiency anemia   . Vitamin D deficiency     HOSPITAL COURSE:   81 year old Caucasian gentleman history of coronary artery disease status post bypass, AICD placement, systolic congestive heart failure presenting with shortness of breath.  1. Acute on chronic systolic congestive heart failure with anasarca: Patient clinical condition did not improve with Lasix drip, still with minimal urine output. Evaluated by cardiology Patient's wife who is the healthcare power of attorney and other significant family members have decided to consider DO NOT RESUSCITATE with comfort care measures Will discontinue Lasix drip Will change high flow oxygen to oxygen via nasal cannula for comfort measures only  2. Acute kidney injury:   3. Atrial fibrillation chronic: Currently V-paced,  discontinue all medications including Coumadin  cardiology ,.Dr. Gwen Pounds has discussed with the patient's wife and other family members and patient's pacemaker with defibrillator will be turned off at the hospice home as soon as possible  4. Generalized anasarca: likely multifactorial, with severe protein calorie malnutrition and cardiorenal syndrome, trial of lasix drip with no significant improvement  5. Hypothyroidism unspecified:   6. Venous thromboembolism prophylactic:   7. Hyperkalemia-     DISCHARGE CONDITIONS:   Guarded  CONSULTS OBTAINED:  Treatment Team:  Lamar Blinks, MD Alwyn Pea, MD   PROCEDURES none DRUG ALLERGIES:   Allergies  Allergen Reactions  . Ambien [Zolpidem Tartrate]   . Erythromycin Other (See Comments)    Reaction:  Unknown   . Penicillins Swelling    DISCHARGE MEDICATIONS:   Current Discharge Medication List    START taking these medications   Details  morphine 20 MG/5ML solution Take 1.3 mLs (5.2 mg total) by mouth every 2 (two) hours as needed for pain. Qty: 20 mL, Refills: 0      CONTINUE these medications which have NOT CHANGED   Details  acetaminophen (TYLENOL) 325 MG tablet Take 650 mg by mouth every 4 (four) hours as needed.    alum & mag hydroxide-simeth (MAALOX/MYLANTA) 200-200-20 MG/5ML suspension Take 30 mLs by mouth every 4 (four) hours as needed for indigestion. Qty: 355 mL, Refills: 0      STOP taking these medications     Cholecalciferol (VITAMIN D3) 2000 UNITS TABS      cyanocobalamin (,VITAMIN B-12,) 1000 MCG/ML  injection      docusate sodium (COLACE) 100 MG capsule      donepezil (ARICEPT) 10 MG tablet      ferrous fumarate-iron polysaccharide complex (TANDEM) 162-115.2 MG CAPS      insulin NPH-regular Human (HUMULIN 70/30) (70-30) 100 UNIT/ML injection      isosorbide dinitrate (ISORDIL) 10 MG tablet      levothyroxine (SYNTHROID, LEVOTHROID) 112 MCG tablet      memantine (NAMENDA) 10 MG  tablet      Multiple Vitamins-Minerals (PRESERVISION AREDS PO)      omeprazole (PRILOSEC) 20 MG capsule      oxyCODONE (OXY IR/ROXICODONE) 5 MG immediate release tablet      senna-docusate (SENNA PLUS) 8.6-50 MG per tablet      sertraline (ZOLOFT) 25 MG tablet      sevelamer (RENAGEL) 800 MG tablet      simvastatin (ZOCOR) 40 MG tablet      spironolactone (ALDACTONE) 25 MG tablet      sulfamethoxazole-trimethoprim (BACTRIM DS,SEPTRA DS) 800-160 MG per tablet      tamsulosin (FLOMAX) 0.4 MG CAPS capsule      torsemide (DEMADEX) 20 MG tablet      traMADol (ULTRAM) 50 MG tablet      traZODone (DESYREL) 100 MG tablet      warfarin (COUMADIN) 1 MG tablet      warfarin (COUMADIN) 2.5 MG tablet      warfarin (COUMADIN) 6 MG tablet      amLODipine (NORVASC) 5 MG tablet      bisacodyl (DULCOLAX) 10 MG suppository      enoxaparin (LOVENOX) 30 MG/0.3ML injection      menthol-cetylpyridinium (CEPACOL) 3 MG lozenge          DISCHARGE INSTRUCTIONS:   patient will be transferred to hospice home when bed is available   DIET:  Regular as tolerated  DISCHARGE CONDITION:  Guarded  ACTIVITY:   Bedrest with bathroom prophylaxis OXYGEN:  Home Oxygen: Yes.     Oxygen Delivery: at 2-4 liters/min via Patient connected to nasal cannula oxygen  DISCHARGE LOCATION:  Hospice home when bed is available  If you experience worsening of your admission symptoms, develop shortness of breath, life threatening emergency, suicidal or homicidal thoughts you must seek medical attention immediately by calling 911 or calling your MD immediately  if symptoms less severe.  You Must read complete instructions/literature along with all the possible adverse reactions/side effects for all the Medicines you take and that have been prescribed to you. Take any new Medicines after you have completely understood and accpet all the possible adverse reactions/side effects.   Please note  You were  cared for by a hospitalist during your hospital stay. If you have any questions about your discharge medications or the care you received while you were in the hospital after you are discharged, you can call the unit and asked to speak with the hospitalist on call if the hospitalist that took care of you is not available. Once you are discharged, your primary care physician will handle any further medical issues. Please note that NO REFILLS for any discharge medications will be authorized once you are discharged, as it is imperative that you return to your primary care physician (or establish a relationship with a primary care physician if you do not have one) for your aftercare needs so that they can reassess your need for medications and monitor your lab values.     Today  Chief Complaint  Patient  presents with  . Shortness of Breath    patient is very lethargic and unable to get  any history from him. Patient's wife, son and other significant family members were at bedside. After having lengthy discussion with the wife and other family members, they have decided to consider transferring the patient to hospice home if bed is available today. Cardiology Dr. Gwen Pounds has discussed with the family members regarding the patient's pacer defibrillator, which are returned off at hospice home ASAP ROS: unobtainable   VITAL SIGNS:  Blood pressure 114/62, pulse 112, temperature 97.4 F (36.3 C), temperature source Oral, resp. rate 19, height  (1.753 m), weight 89.268 kg (196 lb 12.8 oz), SpO2 94 %.  I/O:   Intake/Output Summary (Last 24 hours) at 02/13/15 1403 Last data filed at 02/13/15 1009  Gross per 24 hour  Intake    365 ml  Output    350 ml  Net     15 ml    PHYSICAL EXAMINATION:  GENERAL:  81 y.o.-year-old patient lying in the bed with high flow oxygen, with generalized edema EYES: Pupils equal, round, reactive to light and accommodation. HEENT: Head atraumatic, normocephalic.NECK:   Supple, no jugular venous distention. No thyroid enlargement, no tenderness.  LUNGS: Pulmonary edema with a gurgly breath sounds, poor inspiratory effort  CARDIOVASCULAR: irregularly irregular.  ABDOMEN: Soft, non-tender, non-distended. Bowel sounds present. EXTREMITIES: 3+ pitting pedal edema NEUROLOGIC: Patient is lethargic  PSYCHIATRIC: The patient is lethargic SKIN: anasarca.   DATA REVIEW:   CBC  Recent Labs Lab 02/10/15 0215  WBC 9.3  HGB 9.4*  HCT 30.0*  PLT 154    Chemistries   Recent Labs Lab 02/10/15 0215  02/13/15 0426  NA 137  < > 137  K 4.8  < > 5.4*  CL 102  < > 100*  CO2 22  < > 23  GLUCOSE 140*  < > 189*  BUN 89*  < > 104*  CREATININE 5.01*  < > 5.53*  CALCIUM 8.1*  < > 8.2*  AST 36  --   --   ALT 18  --   --   ALKPHOS 73  --   --   BILITOT 0.4  --   --   < > = values in this interval not displayed.  Cardiac Enzymes  Recent Labs Lab 02/10/15 0745  TROPONINI 0.09*    Microbiology Results  Results for orders placed or performed during the hospital encounter of 02/09/15  MRSA PCR Screening     Status: None   Collection Time: 02/10/15  2:55 AM  Result Value Ref Range Status   MRSA by PCR NEGATIVE NEGATIVE Final    Comment:        The GeneXpert MRSA Assay (FDA approved for NASAL specimens only), is one component of a comprehensive MRSA colonization surveillance program. It is not intended to diagnose MRSA infection nor to guide or monitor treatment for MRSA infections.     RADIOLOGY:  Dg Chest Portable 1 View  02/10/2015   CLINICAL DATA:  Shortness of breath.  Cough.  EXAM: PORTABLE CHEST - 1 VIEW  COMPARISON:  11/25/2014  FINDINGS: Postoperative changes in the mediastinum. Cardiac pacemaker. Cardiac enlargement with mild pulmonary vascular congestion. Blunting of the costophrenic angles suggesting bilateral effusions. Hazy perihilar opacities may indicate edema. No pneumothorax. Mediastinal contours appear intact. Linear shadow along  the right chest wall probably represents skin fold.  IMPRESSION: Cardiac enlargement with pulmonary vascular congestion, probable perihilar edema, and small  bilateral pleural effusions.   Electronically Signed   By: Burman Nieves M.D.   On: 02/10/2015 00:10    EKG:   Orders placed or performed during the hospital encounter of 02/09/15  . ED EKG  . ED EKG  . EKG 12-Lead  . EKG 12-Lead      Management plans discussed with the patient, family and they are in agreement.  CODE STATUS:  DO NOT RESUSCITATE with comfort care measuresa    Code Status Orders        Start     Ordered   02/10/15 0945  Do not attempt resuscitation (DNR)   Continuous    Question Answer Comment  In the event of cardiac or respiratory ARREST Do not call a "code blue"   In the event of cardiac or respiratory ARREST Do not perform Intubation, CPR, defibrillation or ACLS   In the event of cardiac or respiratory ARREST Use medication by any route, position, wound care, and other measures to relive pain and suffering. May use oxygen, suction and manual treatment of airway obstruction as needed for comfort.      02/10/15 0944    Advance Directive Documentation        Most Recent Value   Type of Advance Directive  Healthcare Power of Attorney, Living will   Pre-existing out of facility DNR order (yellow form or pink MOST form)     "MOST" Form in Place?        TOTAL TIME TAKING CARE OF THIS PATIENT: 50  minutes.  Greater than 50% of the visit is spent on correlation of care   @[facility] @  on 02/13/2015 at 2:03 PM  Between 7am to 6pm - Pager - [phone]  After 6pm go to [email or web address] - password [email or web address] [facility]  [place]  [facility]  Office  [phone]  CC: Primary care physician; Lauro Regulus., MD

## 2015-02-13 NOTE — Discharge Instructions (Signed)
Transfer patient to hospice home when bed is available Provide oxygen via nasal cannula for comfort measures Diet as tolerated Activity bedrest

## 2015-02-13 NOTE — Progress Notes (Signed)
Pt is alert, V-paced on tele with family at bedside. Pt made comfort care. Pt weaned from high flow [place] to 6L [place] by respiratory. Pt to d/c to hospice home, report called to Lauren at hospice home. Family thoroughly educated about the hospice process from myself, Gouru and Gwen Pounds. Cardiology to deactivate pacemaker on tomorrow and family made aware. Pt currently awaiting EMS for transport.

## 2015-02-13 NOTE — Care Management (Signed)
Patient  Is to be transferred to th [facility] today.  Coordination is in progress

## 2015-02-13 NOTE — Progress Notes (Signed)
[facility] Encounter Note  Patient: Tim Myers / Admit Date: 02/09/2015 / Date of Encounter: 02/13/2015, 12:11 PM   Subjective: Patient is not responsive at this time and will need hospice  Review of Systems:  Unable to assess Objective: Telemetry: Paced rhythm Physical Exam: Blood pressure 114/62, pulse 112, temperature 97.4 F (36.3 C), temperature source Oral, resp. rate 19, height  (1.753 m), weight 196 lb 12.8 oz (89.268 kg), SpO2 94 %. Body mass index is 29.05 kg/(m^2). General: Disheveled and no responses Head: Normocephalic, atraumatic, sclera non-icteric, no xanthomas, nares are without discharge. Neck: No apparent masses Lungs: Normal respirations with diffuse wheezes, many rhonchi, no rales , few crackles   Heart: Irregular rate and rhythm, normal S1 S2, 2+ mitral murmur, no rub, no gallop, PMI is normal size and placement, carotid upstroke normal without bruit, jugular venous pressure normal Abdomen: Soft, non-tender, non-distended with normoactive bowel sounds. No hepatosplenomegaly. Abdominal aorta is normal size without bruit Extremities: 1+ edema, no clubbing, no cyanosis, no ulcers,  Peripheral: 2+ radial, 2+ femoral, 2+ dorsal pedal pulses Neuro: Alert and oriented. Moves all extremities spontaneously.    Intake/Output Summary (Last 24 hours) at 02/13/15 1211 Last data filed at 02/13/15 1009  Gross per 24 hour  Intake    365 ml  Output    350 ml  Net     15 ml    Inpatient Medications:  . antiseptic oral rinse  7 mL Mouth Rinse q12n4p  . chlorhexidine  15 mL Mouth Rinse BID  . collagenase   Topical Daily  . donepezil  10 mg Oral q morning - 10a  . haloperidol lactate  5 mg Intravenous Once  . insulin aspart  0-5 Units Subcutaneous QHS  . insulin aspart  0-9 Units Subcutaneous TID WC  . insulin aspart protamine- aspart  12 Units Subcutaneous BID WC  . isosorbide dinitrate  10 mg Oral BID  . levothyroxine  112 mcg Oral q morning  - 10a  . memantine  10 mg Oral BID  . methylPREDNISolone (SOLU-MEDROL) injection  60 mg Intravenous Q12H  . pantoprazole  40 mg Oral Daily  . polyethylene glycol  17 g Oral BID  . senna-docusate  2 tablet Oral BID  . sertraline  25 mg Oral q morning - 10a  . sevelamer carbonate  800 mg Oral TID WC  . simvastatin  20 mg Oral QHS  . sodium chloride  3 mL Intravenous Q12H  . tamsulosin  0.4 mg Oral Daily  . torsemide  40 mg Oral q morning - 10a  . traZODone  100 mg Oral QHS   Infusions:     Labs:  Recent Labs  02/12/15 1002 02/13/15 0426  NA 133* 137  K 6.1* 5.4*  CL 100* 100*  CO2 19* 23  GLUCOSE 263* 189*  BUN 101* 104*  CREATININE 5.62* 5.53*  CALCIUM 7.8* 8.2*   No results for input(s): AST, ALT, ALKPHOS, BILITOT, PROT, ALBUMIN in the last 72 hours. No results for input(s): WBC, NEUTROABS, HGB, HCT, MCV, PLT in the last 72 hours. No results for input(s): CKTOTAL, CKMB, TROPONINI in the last 72 hours. Invalid input(s): POCBNP No results for input(s): HGBA1C in the last 72 hours.   Weights: Filed Weights   02/11/15 0500 02/12/15 0619 02/13/15 0539  Weight: 191 lb 14.4 oz (87.045 kg) 195 lb 4.8 oz (88.587 kg) 196 lb 12.8 oz (89.268 kg)     Radiology/Studies:  Dg Chest Portable 1  View  02/10/2015   CLINICAL DATA:  Shortness of breath.  Cough.  EXAM: PORTABLE CHEST - 1 VIEW  COMPARISON:  11/25/2014  FINDINGS: Postoperative changes in the mediastinum. Cardiac pacemaker. Cardiac enlargement with mild pulmonary vascular congestion. Blunting of the costophrenic angles suggesting bilateral effusions. Hazy perihilar opacities may indicate edema. No pneumothorax. Mediastinal contours appear intact. Linear shadow along the right chest wall probably represents skin fold.  IMPRESSION: Cardiac enlargement with pulmonary vascular congestion, probable perihilar edema, and small bilateral pleural effusions.   Electronically Signed   By: Burman Nieves M.D.   On: 02/10/2015 00:10      Assessment and Recommendation  81 y.o. male with known coronary artery disease and LV systolic dysfunction with diabetes chronic nonvalvular atrial fibrillation with acute on chronic systolic dysfunction congestive heart failure most consistent with the acute renal failure causing volume overload and respiratory failure with bronchitis and possible aspiration now with no evidence of significant improvements of renal failure and therefore multiorgan failure needing further treatment 1. Discussed with family that the patient may not have the significant improvements with medication management and therefore hospice may be an appropriate track and they are in agreement at this time with hospice taking care of them today 2. We had a long discussion about defibrillator and pacemaker for which we will be turned off at the earliest convenience and hospice  Signed, Arnoldo Hooker M.D. FACC

## 2015-02-13 NOTE — Progress Notes (Signed)
Pt placed on HFNC because pt was not tolerating NRB mask. Pt would not wear NRB mask.

## 2015-02-13 NOTE — Clinical Social Work Note (Signed)
Patient's wife has decided upon hospice home for her husband. CSW received a call from [place] with [facility] and she informed me of the above and that patient's nurse had contacted her. Clydie Braun gave this CSW Lauren's number at hospice home: [phone]. CSW contacted Lauren and she confirmed that they will have a bed this afternoon and that they can take patient. She needs the hospice home discharge summary to be faxed. Nurse will call report. Lauren has requested if the wife could come prior to do paperwork and tour the facility and meet the staff for a more smooth transition. CSW has informed patient's wife and they will go ahead to the facility as soon as other family comes back from lunch. Patient's nurse aware of the above and informed CSW that the physician would be returning to decide how much oxygen to place patient on and to the discharge info. York Spaniel MSW,LCSW [phone]

## 2015-02-17 ENCOUNTER — Ambulatory Visit: Payer: Self-pay | Admitting: Urology

## 2015-02-17 DEATH — deceased

## 2015-03-07 ENCOUNTER — Ambulatory Visit: Payer: Medicare Other | Admitting: Urology

## 2016-11-18 IMAGING — DX DG CHEST 1V PORT
1 series · 1 of 1 positions shown · non-contrast
Comparison: 06/22/2011

CLINICAL DATA: Pre operative respiratory exam.  Right hip fracture.

EXAM:
PORTABLE CHEST - 1 VIEW

[chest ap]
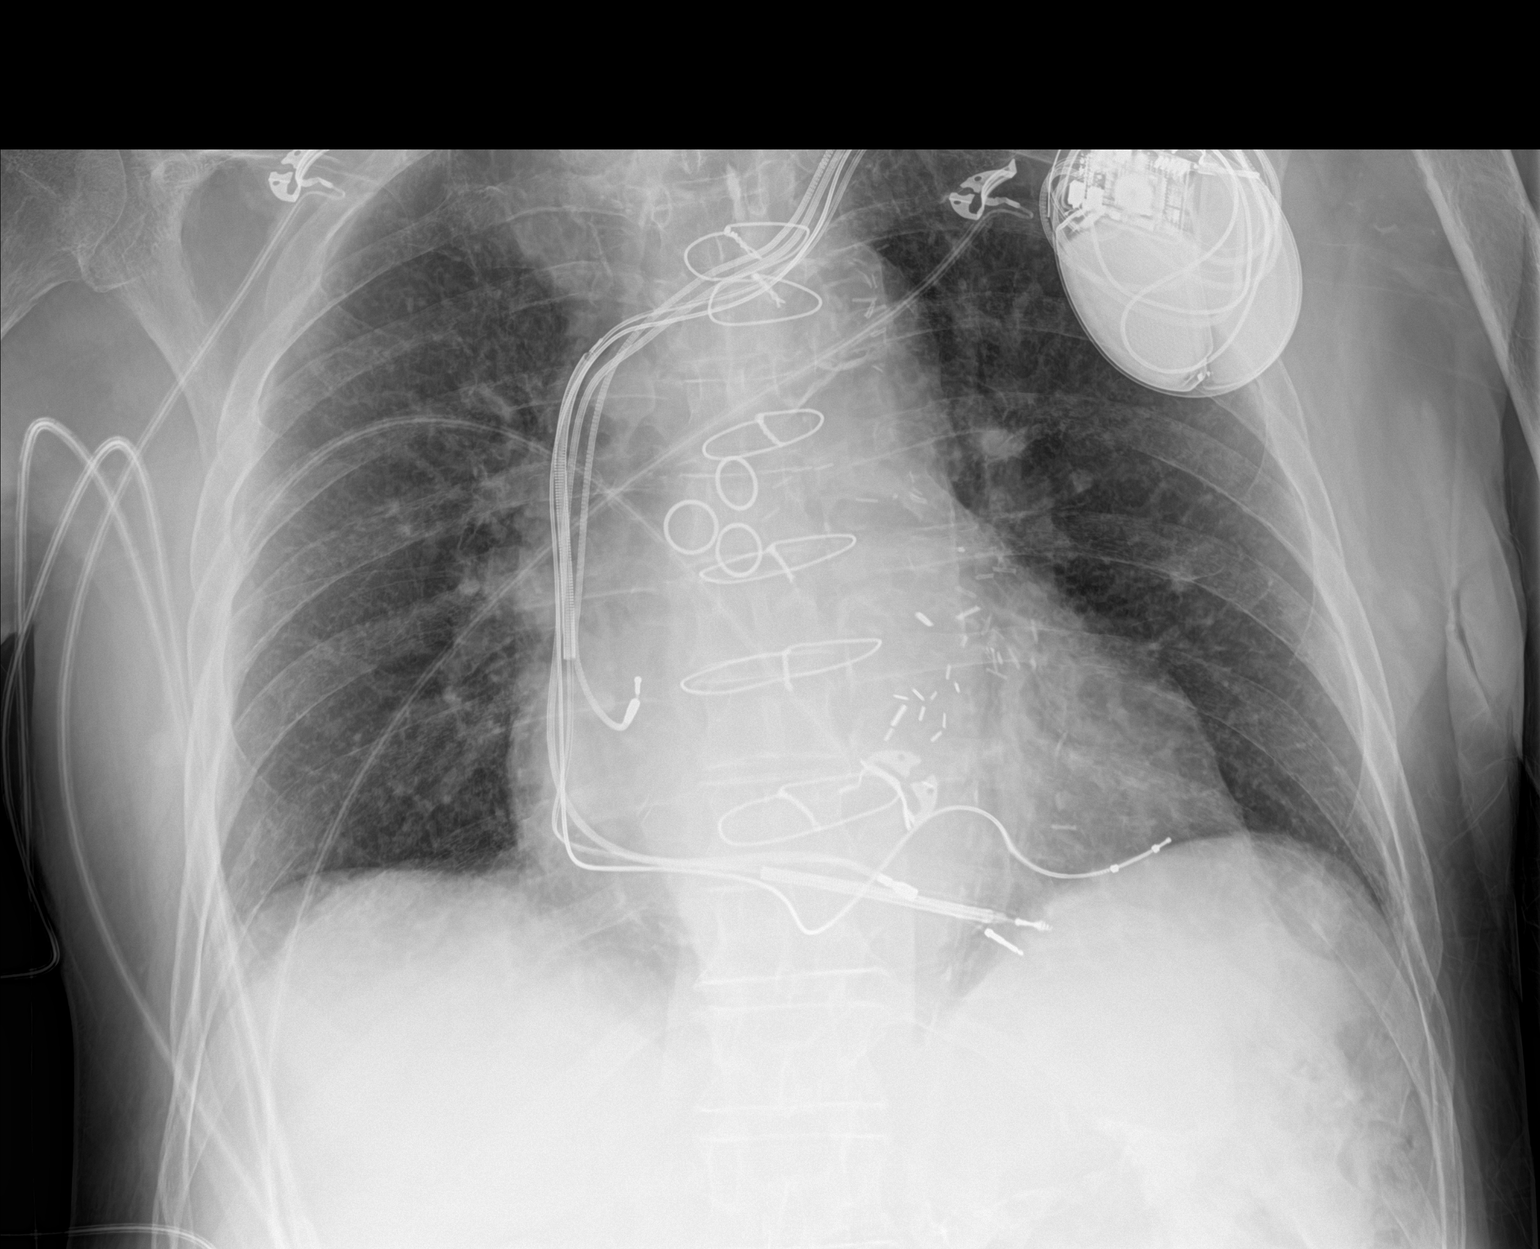

[1 of 1 positions shown; findings below may reference images not displayed]

FINDINGS: Heart size and pulmonary vascularity are normal. Tortuosity and
calcification of the thoracic aorta. Slight no infiltrates or
effusions. CABG. Pacemaker and AICD. Osteopenia. No acute osseous
abnormality. Nodular density in the left midzone is unchanged and
probably represents an end on blood vessel.
IMPRESSION: No acute abnormality.

## 2016-11-19 IMAGING — CR DG HIP (WITH OR WITHOUT PELVIS) 1V PORT*R*
1 series · 2 of 2 positions shown · non-contrast
Comparison: 11/25/2014

CLINICAL DATA: Right hip fracture.

EXAM:
RIGHT HIP (WITH PELVIS) 1 VIEW PORTABLE

[Series 1: lat · 0.17mm/px · 2 of 2 slices shown]
[im 1/2]
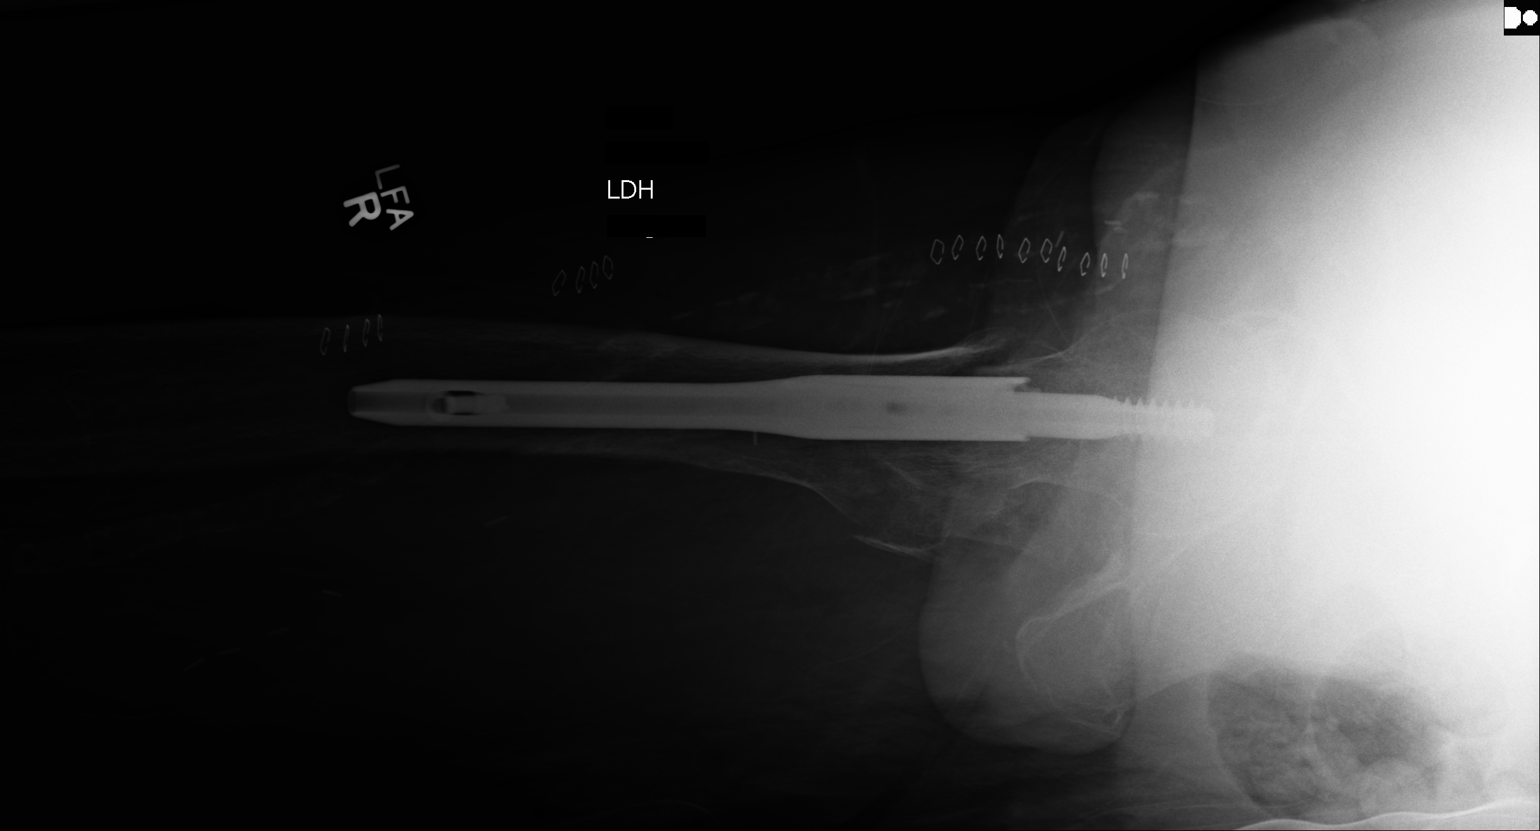
[im 2/2]
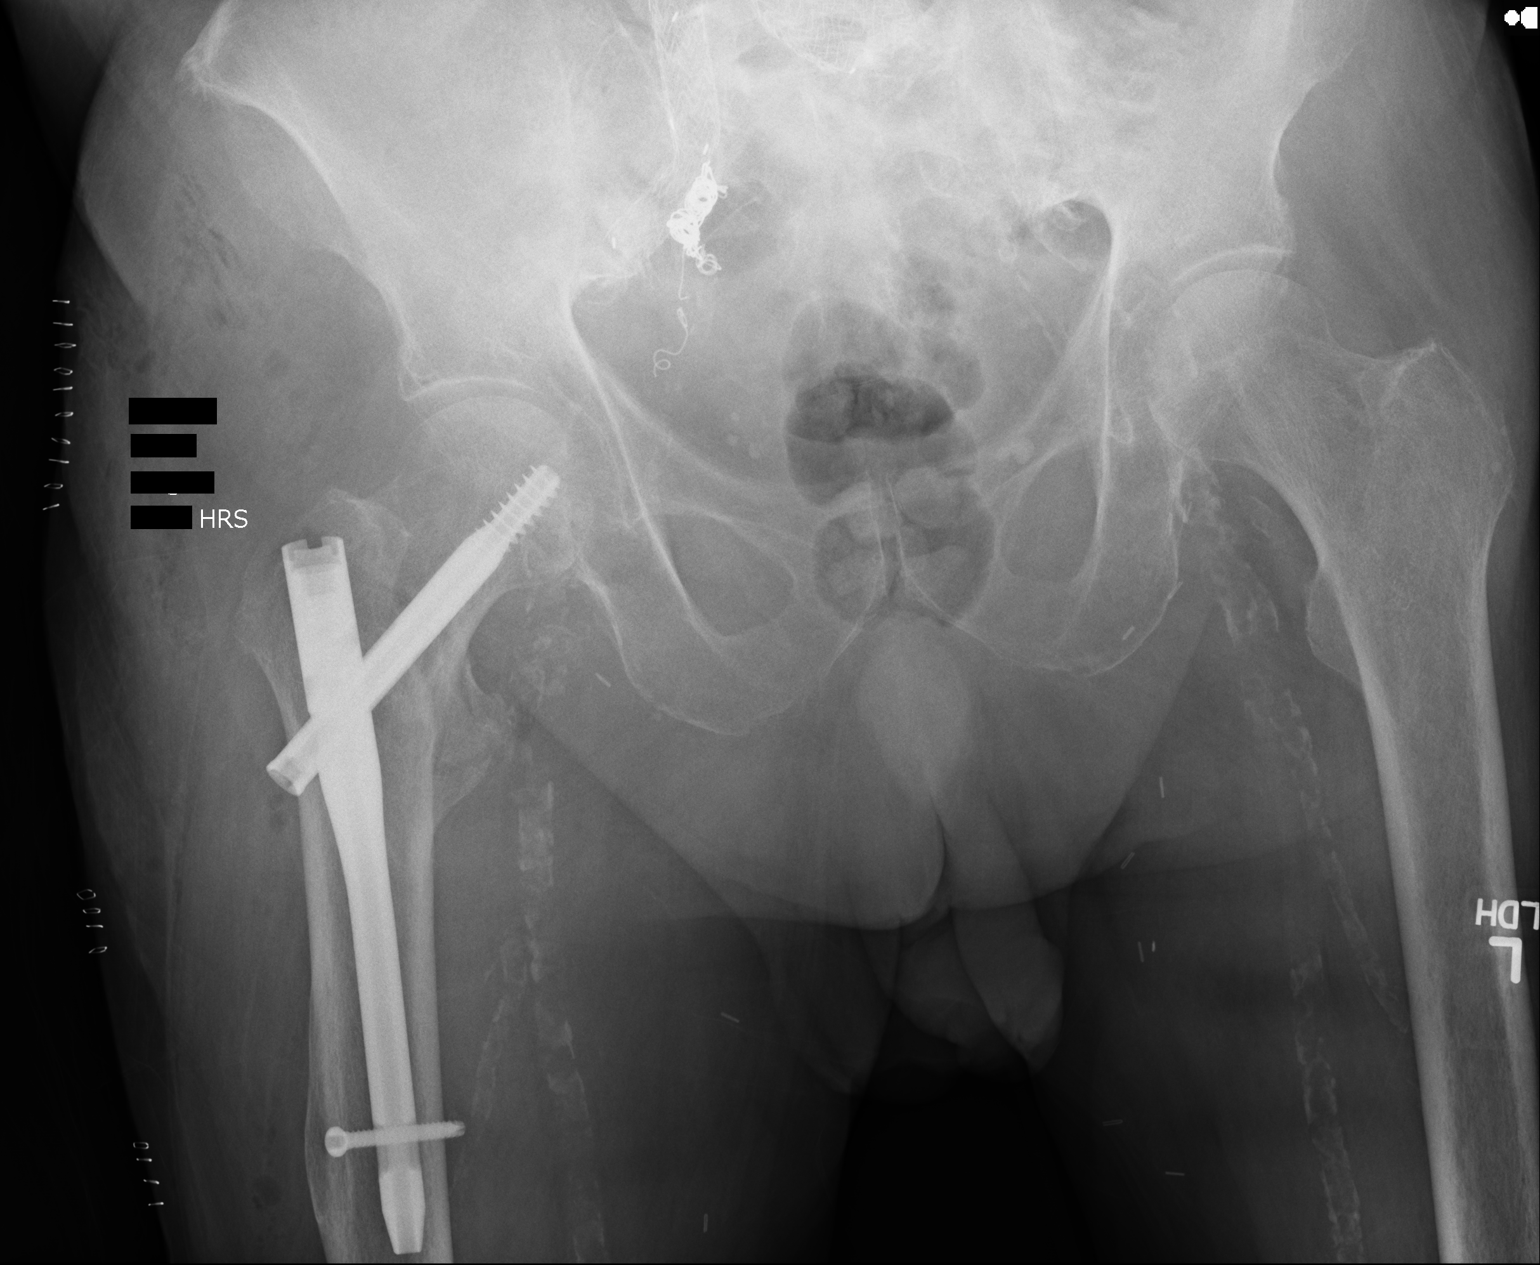

[2 of 2 positions shown; findings below may reference images not displayed]

FINDINGS: The patient has undergone open reduction and internal fixation of
the intertrochanteric fracture of the proximal right femur.
Alignment and position of the fracture fragments is near anatomic.
Intramedullary nail and screw have been inserted and appear in good
position. Visualized pelvic bones are intact.
IMPRESSION: Satisfactory appearance of the right hip after open reduction and
internal fixation of intertrochanteric fracture.
